# Patient Record
Sex: Female | Born: 1990 | Race: Black or African American | Hispanic: No | Marital: Single | Smoking: Never smoker
Health system: Southern US, Community
[De-identification: ages and names within clinical notes are randomized; demographics above are authoritative.]

## PROBLEM LIST (undated history)

## (undated) DIAGNOSIS — I639 Cerebral infarction, unspecified: Secondary | ICD-10-CM

## (undated) DIAGNOSIS — M329 Systemic lupus erythematosus, unspecified: Secondary | ICD-10-CM

## (undated) DIAGNOSIS — M359 Systemic involvement of connective tissue, unspecified: Secondary | ICD-10-CM

## (undated) HISTORY — PX: HERNIA REPAIR: SHX51

---
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
## 2015-04-26 ENCOUNTER — Emergency Department: Payer: BLUE CROSS/BLUE SHIELD

## 2015-04-26 ENCOUNTER — Emergency Department
Admission: EM | Admit: 2015-04-26 | Discharge: 2015-04-26 | Disposition: A | Discharge status: Home / Self Care | Payer: BLUE CROSS/BLUE SHIELD | Attending: Emergency Medicine | Admitting: Emergency Medicine

## 2015-04-26 DIAGNOSIS — J189 Pneumonia, unspecified organism: Secondary | ICD-10-CM | POA: Diagnosis not present

## 2015-04-26 DIAGNOSIS — R000 Tachycardia, unspecified: Secondary | ICD-10-CM | POA: Diagnosis not present

## 2015-04-26 DIAGNOSIS — R0602 Shortness of breath: Secondary | ICD-10-CM | POA: Diagnosis present

## 2015-04-26 LAB — CBC WITH DIFFERENTIAL/PLATELET
Basophils Absolute: 0 10*3/uL (ref 0–0.1)
Basophils Relative: 0 %
EOS ABS: 0 10*3/uL (ref 0–0.7)
EOS PCT: 0 %
HCT: 39 % (ref 35.0–47.0)
Hemoglobin: 13 g/dL (ref 12.0–16.0)
LYMPHS ABS: 1 10*3/uL (ref 1.0–3.6)
Lymphocytes Relative: 5 %
MCH: 27.1 pg (ref 26.0–34.0)
MCHC: 33.4 g/dL (ref 32.0–36.0)
MCV: 81.2 fL (ref 80.0–100.0)
MONO ABS: 1.5 10*3/uL — AB (ref 0.2–0.9)
MONOS PCT: 8 %
Neutro Abs: 16 10*3/uL — ABNORMAL HIGH (ref 1.4–6.5)
Neutrophils Relative %: 87 %
PLATELETS: 274 10*3/uL (ref 150–440)
RBC: 4.8 MIL/uL (ref 3.80–5.20)
RDW: 15.1 % — AB (ref 11.5–14.5)
WBC: 18.6 10*3/uL — ABNORMAL HIGH (ref 3.6–11.0)

## 2015-04-26 LAB — BASIC METABOLIC PANEL
Anion gap: 9 (ref 5–15)
BUN: 8 mg/dL (ref 6–20)
CHLORIDE: 104 mmol/L (ref 101–111)
CO2: 19 mmol/L — ABNORMAL LOW (ref 22–32)
Calcium: 8.5 mg/dL — ABNORMAL LOW (ref 8.9–10.3)
Creatinine, Ser: 0.88 mg/dL (ref 0.44–1.00)
GFR calc Af Amer: >60 mL/min (ref >60)
Glucose, Bld: 109 mg/dL — ABNORMAL HIGH (ref 65–99)
POTASSIUM: 3.3 mmol/L — AB (ref 3.5–5.1)
SODIUM: 132 mmol/L — AB (ref 135–145)

## 2015-04-26 LAB — RAPID INFLUENZA A&B ANTIGENS (ARMC ONLY): INFLUENZA B (ARMC): NEGATIVE

## 2015-04-26 LAB — RAPID INFLUENZA A&B ANTIGENS: Influenza A (ARMC): NEGATIVE

## 2015-04-26 MED ORDER — LEVOFLOXACIN 750 MG PO TABS: 750.0000 mg | ORAL_TABLET | Freq: Every day | ORAL | Status: AC

## 2015-04-26 MED ORDER — LEVOFLOXACIN 750 MG PO TABS
750.0000 mg | ORAL_TABLET | Freq: Every day | ORAL | Status: DC
  Administered 2015-04-26: 750 mg via ORAL
  Filled 2015-04-26: qty 1

## 2015-04-26 MED ORDER — ACETAMINOPHEN 325 MG PO TABS
650.0000 mg | ORAL_TABLET | Freq: Once | ORAL | Status: AC | PRN
  Administered 2015-04-26: 650 mg via ORAL
  Filled 2015-04-26: qty 2

## 2015-04-26 MED ORDER — SODIUM CHLORIDE 0.9 % IV BOLUS (SEPSIS)
1000.0000 mL | Freq: Once | INTRAVENOUS | Status: AC
  Administered 2015-04-26: 1000 mL via INTRAVENOUS

## 2015-04-26 NOTE — Discharge Instructions (Signed)
Please seek medical attention for any high fevers, chest pain, shortness of breath, change in behavior, persistent vomiting, bloody stool or any other new or concerning symptoms. ° °Community-Acquired Pneumonia, Adult °Pneumonia is an infection of the lungs. There are different types of pneumonia. One type can develop while a person is in a hospital. A different type, called community-acquired pneumonia, develops in people who are not, or have not recently been, in the hospital or other health care facility.  °CAUSES °Pneumonia may be caused by bacteria, viruses, or funguses. Community-acquired pneumonia is often caused by Streptococcus pneumonia bacteria. These bacteria are often passed from one person to another by breathing in droplets from the cough or sneeze of an infected person. °RISK FACTORS °The condition is more likely to develop in: °· People who have chronic diseases, such as chronic obstructive pulmonary disease (COPD), asthma, congestive heart failure, cystic fibrosis, diabetes, or kidney disease. °· People who have early-stage or late-stage HIV. °· People who have sickle cell disease. °· People who have had their spleen removed (splenectomy). °· People who have poor dental hygiene. °· People who have medical conditions that increase the risk of breathing in (aspirating) secretions their own mouth and nose.   °· People who have a weakened immune system (immunocompromised). °· People who smoke. °· People who travel to areas where pneumonia-causing germs commonly exist. °· People who are around animal habitats or animals that have pneumonia-causing germs, including birds, bats, rabbits, cats, and farm animals. °SYMPTOMS °Symptoms of this condition include: °· A dry cough. °· A wet (productive) cough. °· Fever. °· Sweating. °· Chest pain, especially when breathing deeply or coughing. °· Rapid breathing or difficulty breathing. °· Shortness of breath. °· Shaking chills. °· Fatigue. °· Muscle  aches. °DIAGNOSIS °Your health care provider will take a medical history and perform a physical exam. You may also have other tests, including: °· Imaging studies of your chest, including X-rays. °· Tests to check your blood oxygen level and other blood gases. °· Other tests on blood, mucus (sputum), fluid around your lungs (pleural fluid), and urine. °If your pneumonia is severe, other tests may be done to identify the specific cause of your illness. °TREATMENT °The type of treatment that you receive depends on many factors, such as the cause of your pneumonia, the medicines you take, and other medical conditions that you have. For most adults, treatment and recovery from pneumonia may occur at home. In some cases, treatment must happen in a hospital. Treatment may include: °· Antibiotic medicines, if the pneumonia was caused by bacteria. °· Antiviral medicines, if the pneumonia was caused by a virus. °· Medicines that are given by mouth or through an IV tube. °· Oxygen. °· Respiratory therapy. °Although rare, treating severe pneumonia may include: °· Mechanical ventilation. This is done if you are not breathing well on your own and you cannot maintain a safe blood oxygen level. °· Thoracentesis. This procedure removes fluid around one lung or both lungs to help you breathe better. °HOME CARE INSTRUCTIONS °· Take over-the-counter and prescription medicines only as told by your health care provider. °¨ Only take cough medicine if you are losing sleep. Understand that cough medicine can prevent your body's natural ability to remove mucus from your lungs. °¨ If you were prescribed an antibiotic medicine, take it as told by your health care provider. Do not stop taking the antibiotic even if you start to feel better. °· Sleep in a semi-upright position at night. Try sleeping in a reclining chair, or place a few pillows under your head. °· Do   not use tobacco products, including cigarettes, chewing tobacco, and  e-cigarettes. If you need help quitting, ask your health care provider. °· Drink enough water to keep your urine clear or pale yellow. This will help to thin out mucus secretions in your lungs. °PREVENTION °There are ways that you can decrease your risk of developing community-acquired pneumonia. Consider getting a pneumococcal vaccine if: °· You are older than 27 years of age. °· You are older than 27 years of age and are undergoing cancer treatment, have chronic lung disease, or have other medical conditions that affect your immune system. Ask your health care provider if this applies to you. °There are different types and schedules of pneumococcal vaccines. Ask your health care provider which vaccination option is best for you. °You may also prevent community-acquired pneumonia if you take these actions: °· Get an influenza vaccine every year. Ask your health care provider which type of influenza vaccine is best for you. °· Go to the dentist on a regular basis. °· Wash your hands often. Use hand sanitizer if soap and water are not available. °SEEK MEDICAL CARE IF: °· You have a fever. °· You are losing sleep because you cannot control your cough with cough medicine. °SEEK IMMEDIATE MEDICAL CARE IF: °· You have worsening shortness of breath. °· You have increased chest pain. °· Your sickness becomes worse, especially if you are an older adult or have a weakened immune system. °· You cough up blood. °  °This information is not intended to replace advice given to you by your health care provider. Make sure you discuss any questions you have with your health care provider. °  °Document Released: 01/04/2005 Document Revised: 09/25/2014 Document Reviewed: 05/01/2014 °Elsevier Interactive Patient Education ©2016 [employer] ° °

## 2015-04-26 NOTE — ED Provider Notes (Signed)
[facility] Emergency Department Provider Note    ____________________________________________  Time seen: 1435  I have reviewed the triage vital signs and the nursing notes.   HISTORY  Chief Complaint Tachycardia and Shortness of Breath   History limited by: Not Limited   HPI Lydia Miller is a 25 y.o. female who presents to the emergency department today because of concerns for continued shortness of breath, chills and chest pain. The patient states that she was diagnosed with an upper respiratory infection roughly 2 weeks ago. She was put on a course of amoxicillin. She states she finished that 6 days ago. Since that time she has continued to have some cold-like symptoms. She describes shortness of breath. She has had lower chest pain. She describes a cough. Yesterday and today she has felt chills. No measured fevers. 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     No past medical history on file.  There are no active problems to display for this patient.   No past surgical history on file.  Current Outpatient Rx  Name  Route  Sig  Dispense  Refill  . levofloxacin (LEVAQUIN) 750 MG tablet   Oral   Take 1 tablet (750 mg total) by mouth daily.   7 tablet   0     Allergies Review of patient's allergies indicates no known allergies.  No family history on file.  Social History Works in a bank  Review of Systems  Constitutional: Positive for chills. Cardiovascular: Positive for chest pain. Respiratory: Positive for shortness of breath. Gastrointestinal: Negative for abdominal pain, vomiting and diarrhea. Neurological: Negative for headaches, focal weakness or numbness.  10-point ROS otherwise negative.  ____________________________________________   PHYSICAL EXAM:  VITAL SIGNS: ED Triage Vitals  Enc Vitals Group     BP 04/26/15 1351 121/99 mmHg     Pulse Rate 04/26/15 1351 117     Resp 04/26/15 1351 26      Temp 04/26/15 1351 102.7 F (39.3 C)     Temp Source 04/26/15 1351 Oral     SpO2 04/26/15 1351 98 %     Weight 04/26/15 1351 190 lb (86.183 kg)     Height 04/26/15 1351  (1.676 m)     Head Cir --      Peak Flow --      Pain Score 04/26/15 1358 8   Constitutional: Alert and oriented. Well appearing and in no distress. Eyes: Conjunctivae are normal. PERRL. Normal extraocular movements. ENT   Head: Normocephalic and atraumatic.   Nose: No congestion/rhinnorhea.   Mouth/Throat: Mucous membranes are moist.   Neck: No stridor. Hematological/Lymphatic/Immunilogical: No cervical lymphadenopathy. Cardiovascular: tachycardic, regular rhythm.  No murmurs, rubs, or gallops. Respiratory: Minimally increased respiratory effort. Tachypnea. No wheezes, rhonchi or crackles appreciated.  Gastrointestinal: Soft and nontender. No distention.  Genitourinary: Deferred Musculoskeletal: Normal range of motion in all extremities. No joint effusions.  No lower extremity tenderness nor edema. Neurologic:  Normal speech and language. No gross focal neurologic deficits are appreciated.  Skin:  Skin is warm, dry and intact. No rash noted. Psychiatric: Mood and affect are normal. Speech and behavior are normal. Patient exhibits appropriate insight and judgment.  ____________________________________________    LABS (pertinent positives/negatives)  Labs Reviewed  CBC WITH DIFFERENTIAL/PLATELET - Abnormal; Notable for the following:    WBC 18.6 (*)    RDW 15.1 (*)    Neutro Abs 16.0 (*)    Monocytes Absolute  1.5 (*)    All other components within normal limits  BASIC METABOLIC PANEL - Abnormal; Notable for the following:    Sodium 132 (*)    Potassium 3.3 (*)    CO2 19 (*)    Glucose, Bld 109 (*)    Calcium 8.5 (*)    All other components within normal limits  RAPID INFLUENZA A&B ANTIGENS ([facility] ONLY)    ____________________________________________   EKG  I, Phineas SemenGraydon Angellica Maddison,  attending physician, personally viewed and interpreted this EKG  EKG Time: 1357 Rate: 117 Rhythm: sinus tachycardia Axis: normal Intervals: qtc 393 QRS: narrow ST changes: no st elevation Impression: abnormal ekg   ____________________________________________    RADIOLOGY  CXR IMPRESSION: Small left lower lobe pneumonia.  ____________________________________________   PROCEDURES  Procedure(s) performed: None  Critical Care performed: No  ____________________________________________   INITIAL IMPRESSION / ASSESSMENT AND PLAN / ED COURSE  Pertinent labs & imaging results that were available during my care of the patient were reviewed by me and considered in my medical decision making (see chart for details).  Patient presented to the emergency department today because of concerns for shortness of breath, chills and chest pain. Chest x-ray is consistent with a pneumonia. Patient recently finished a course of amoxicillin for an upper respiratory tract infection. Patient was tachycardic so was given IV fluids. Will give patient a dose of Levaquin here in the emergency Department and plan on discharging with further antibiotics.  ____________________________________________   FINAL CLINICAL IMPRESSION(S) / ED DIAGNOSES  Final diagnoses:  Community acquired pneumonia     Phineas SemenGraydon Azeez Dunker, MD 04/26/15 1541

## 2015-04-26 NOTE — ED Notes (Addendum)
Pt with recent cold symptoms. C/o SOB. fever 102.7

## 2015-04-29 ENCOUNTER — Other Ambulatory Visit: Payer: Self-pay | Admitting: Family Medicine

## 2015-04-29 ENCOUNTER — Ambulatory Visit
Admission: RE | Admit: 2015-04-29 | Discharge: 2015-04-29 | Disposition: A | Discharge status: Home / Self Care | Payer: BLUE CROSS/BLUE SHIELD | Source: Ambulatory Visit | Attending: Family Medicine | Admitting: Family Medicine

## 2015-04-29 DIAGNOSIS — J189 Pneumonia, unspecified organism: Secondary | ICD-10-CM | POA: Diagnosis not present

## 2015-04-29 DIAGNOSIS — R599 Enlarged lymph nodes, unspecified: Secondary | ICD-10-CM | POA: Diagnosis not present

## 2015-04-29 DIAGNOSIS — Z8701 Personal history of pneumonia (recurrent): Secondary | ICD-10-CM | POA: Diagnosis present

## 2015-04-29 MED ORDER — IOPAMIDOL (ISOVUE-370) INJECTION 76%
75.0000 mL | Freq: Once | INTRAVENOUS | Status: AC | PRN
  Administered 2015-04-29: 75 mL via INTRAVENOUS

## 2015-05-20 ENCOUNTER — Emergency Department: Payer: BLUE CROSS/BLUE SHIELD

## 2015-05-20 ENCOUNTER — Emergency Department
Admission: EM | Admit: 2015-05-20 | Discharge: 2015-05-20 | Disposition: A | Discharge status: Home / Self Care | Payer: BLUE CROSS/BLUE SHIELD | Attending: Emergency Medicine | Admitting: Emergency Medicine

## 2015-05-20 DIAGNOSIS — R0789 Other chest pain: Secondary | ICD-10-CM | POA: Diagnosis present

## 2015-05-20 DIAGNOSIS — R0781 Pleurodynia: Secondary | ICD-10-CM | POA: Insufficient documentation

## 2015-05-20 MED ORDER — MELOXICAM 15 MG PO TABS: 15.0000 mg | ORAL_TABLET | Freq: Every day | ORAL | Status: DC

## 2015-05-20 MED ORDER — ALPRAZOLAM 0.25 MG PO TABS: 0.2500 mg | ORAL_TABLET | Freq: Three times a day (TID) | ORAL | Status: AC | PRN

## 2015-05-20 NOTE — ED Notes (Signed)
Pt in via triage with complaints of intermittent right rib pain since she had PNA in April.  Pt reports being seen for follow up appointment, CT performed to rule out PE.  Pt denies any SOB, cough, congestion.  Pt A/Ox4, no immediate distress at this time.

## 2015-05-20 NOTE — ED Notes (Signed)
Pt in with co right rib pain that started today, states only when she takes a deep breath.  No other symptoms, denies any cough or congestion.  Denies any fever at this time, pt concerned about pneumonia due to hx of pneumonia in April.

## 2015-05-20 NOTE — Discharge Instructions (Signed)
Chest Wall Pain °Chest wall pain is pain in or around the bones and muscles of your chest. Sometimes, an injury causes this pain. Sometimes, the cause may not be known. This pain may take several weeks or longer to get better. °HOME CARE °Pay attention to any changes in your symptoms. Take these actions to help with your pain: °· Rest as told by your doctor. °· Avoid activities that cause pain. Try not to use your chest, belly (abdominal), or side muscles to lift heavy things. °· If directed, apply ice to the painful area: °¨ Put ice in a plastic bag. °¨ Place a towel between your skin and the bag. °¨ Leave the ice on for 20 minutes, 2-3 times per day. °· Take over-the-counter and prescription medicines only as told by your doctor. °· Do not use tobacco products, including cigarettes, chewing tobacco, and e-cigarettes. If you need help quitting, ask your doctor. °· Keep all follow-up visits as told by your doctor. This is important. °GET HELP IF: °· You have a fever. °· Your chest pain gets worse. °· You have new symptoms. °GET HELP RIGHT AWAY IF: °· You feel sick to your stomach (nauseous) or you throw up (vomit). °· You feel sweaty or light-headed. °· You have a cough with phlegm (sputum) or you cough up blood. °· You are short of breath. °  °This information is not intended to replace advice given to you by your health care provider. Make sure you discuss any questions you have with your health care provider. °  °Document Released: 06/23/2007 Document Revised: 09/25/2014 Document Reviewed: 04/01/2014 °Elsevier Interactive Patient Education ©2016 [employer] Inc[employer] ° °

## 2015-05-20 NOTE — ED Notes (Signed)
Pt taken to xray 

## 2015-05-20 NOTE — ED Provider Notes (Signed)
[facility] Emergency Department Provider Note ____________________________________________  Time seen: Approximately 8:06 PM  I have reviewed the triage vital signs and the nursing notes.   HISTORY  Chief Complaint Back Pain    HPI Lydia Miller is a 25 y.o. female who presents to the emergency department for evaluation of intermittent rib pain since having pneumonia in April. She denies shortness breath, cough, congestion or fever today.Patient states that she had a CT scan at [facility]. She states that while at work today, she began having a sharp pain in the right rib area. She is concerned that either the pneumonia has not cleared or she has a "blood clot." She also states that she is wondering if this can be related to anxiety. 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  No past medical history on file.  There are no active problems to display for this patient.   No past surgical history on file.  Current Outpatient Rx  Name  Route  Sig  Dispense  Refill  . ALPRAZolam (XANAX) 0.25 MG tablet   Oral   Take 1 tablet (0.25 mg total) by mouth 3 (three) times daily as needed for anxiety.   14 tablet   0   . meloxicam (MOBIC) 15 MG tablet   Oral   Take 1 tablet (15 mg total) by mouth daily.   30 tablet   0     Allergies Review of patient's allergies indicates no known allergies.  No family history on file.  Social History Social History  Substance Use Topics  . Smoking status: Not on file  . Smokeless tobacco: Not on file  . Alcohol Use: Not on file    Review of Systems Constitutional: Positive for recent illness. ENT: No sore throat. Cardiovascular: Denies chest pain or palpitations. Respiratory: Denies shortness of breath. Gastrointestinal: No abdominal pain.  Musculoskeletal: Pain in right lateral chest wall Skin: Negative for rash. Neurological: Negative for  headaches, focal weakness or numbness.   ____________________________________________   PHYSICAL EXAM:  VITAL SIGNS: ED Triage Vitals  Enc Vitals Group     BP 05/20/15 1921 148/88 mmHg     Pulse Rate 05/20/15 1921 90     Resp 05/20/15 1921 18     Temp 05/20/15 1921 98.3 F (36.8 C)     Temp Source 05/20/15 1921 Oral     SpO2 05/20/15 1921 100 %     Weight 05/20/15 1921 183 lb (83.008 kg)     Height 05/20/15 1921 5\' 7"  (1.702 m)     Head Cir --      Peak Flow --      Pain Score 05/20/15 1923 7     Pain Loc --      Pain Edu? --      Excl. in GC? --     Constitutional: Alert and oriented. Well appearing and in no acute distress. Eyes: Conjunctivae are normal. EOMI. Head: Atraumatic. Nose: No congestion/rhinnorhea. Neck: No stridor.  Respiratory: Normal respiratory effort. Breath sounds clear on auscultation throughout. Musculoskeletal: FROM x 4.  Neurologic:  Normal speech and language. No gross focal neurologic deficits are appreciated. Speech is normal. No gait instability. Skin:  Skin is warm, dry and intact. Atraumatic. Psychiatric: Anxious  ____________________________________________   LABS (all labs ordered are listed, but only abnormal results are displayed)  Labs Reviewed - No data to display ____________________________________________  RADIOLOGY  Chest x-ray negative for pneumonia or acute cardiopulmonary abnormality. I, Kem Boroughs, personally viewed and evaluated these images (plain radiographs) as part of my medical decision making, as well as reviewing the written report by the radiologist.  ____________________________________________   PROCEDURES  Procedure(s) performed:    ____________________________________________   INITIAL IMPRESSION / ASSESSMENT AND PLAN / ED COURSE  Pertinent labs & imaging results that were available during my care of the patient were reviewed by me and considered in my medical decision making (see chart for  details).  Reassurance given to the patient. She will be given a prescription for Xanax 0.25 mg to be used as needed for acute anxiety. She was also given a prescription for meloxicam. She was advised to avoid taking any additional ibuprofen or Motrin as she has been doing since her diagnosis of pneumonia. She was encouraged to follow up with her primary care provider for symptoms that are not improving over the next few days. She was advised to return to the emergency department for symptoms that change or worsen if some unable schedule an appointment. ____________________________________________   FINAL CLINICAL IMPRESSION(S) / ED DIAGNOSES  Final diagnoses:  Pleuritic chest pain       Chinita Pester, FNP 05/20/15 2351  Myrna Blazer, MD 05/22/15 [phone]

## 2015-12-18 ENCOUNTER — Other Ambulatory Visit: Payer: Self-pay | Admitting: Student

## 2015-12-18 DIAGNOSIS — K625 Hemorrhage of anus and rectum: Secondary | ICD-10-CM

## 2015-12-18 DIAGNOSIS — R1084 Generalized abdominal pain: Secondary | ICD-10-CM

## 2016-01-25 ENCOUNTER — Other Ambulatory Visit
Admission: RE | Admit: 2016-01-25 | Discharge: 2016-01-25 | Disposition: A | Discharge status: Home / Self Care | Payer: BLUE CROSS/BLUE SHIELD | Source: Other Acute Inpatient Hospital | Attending: Student | Admitting: Student

## 2016-01-25 DIAGNOSIS — R197 Diarrhea, unspecified: Secondary | ICD-10-CM | POA: Insufficient documentation

## 2016-01-25 LAB — GASTROINTESTINAL PANEL BY PCR, STOOL (REPLACES STOOL CULTURE)

## 2016-01-25 LAB — C DIFFICILE QUICK SCREEN W PCR REFLEX
C Diff antigen: NEGATIVE
C Diff interpretation: NOT DETECTED
C Diff toxin: NEGATIVE

## 2016-01-28 LAB — PANCREATIC ELASTASE, FECAL

## 2016-01-29 ENCOUNTER — Other Ambulatory Visit: Payer: Self-pay | Admitting: Student

## 2016-01-29 ENCOUNTER — Ambulatory Visit: Admission: RE | Admit: 2016-01-29 | Payer: BLUE CROSS/BLUE SHIELD | Source: Ambulatory Visit

## 2016-01-29 DIAGNOSIS — R1084 Generalized abdominal pain: Secondary | ICD-10-CM

## 2016-01-29 DIAGNOSIS — K625 Hemorrhage of anus and rectum: Secondary | ICD-10-CM

## 2016-01-29 DIAGNOSIS — R197 Diarrhea, unspecified: Secondary | ICD-10-CM

## 2016-02-02 ENCOUNTER — Ambulatory Visit
Admission: RE | Admit: 2016-02-02 | Discharge: 2016-02-02 | Disposition: A | Discharge status: Home / Self Care | Payer: BLUE CROSS/BLUE SHIELD | Source: Ambulatory Visit | Attending: Student | Admitting: Student

## 2016-02-02 DIAGNOSIS — K625 Hemorrhage of anus and rectum: Secondary | ICD-10-CM

## 2016-02-02 DIAGNOSIS — R1084 Generalized abdominal pain: Secondary | ICD-10-CM

## 2016-02-02 DIAGNOSIS — R197 Diarrhea, unspecified: Secondary | ICD-10-CM

## 2016-02-02 MED ORDER — IOPAMIDOL (ISOVUE-300) INJECTION 61%
100.0000 mL | Freq: Once | INTRAVENOUS | Status: AC | PRN
Start: 2016-02-02 — End: 2016-02-02
  Administered 2016-02-02: 100 mL via INTRAVENOUS

## 2016-03-02 ENCOUNTER — Emergency Department
Admission: EM | Admit: 2016-03-02 | Discharge: 2016-03-02 | Disposition: A | Discharge status: Home / Self Care | Payer: BLUE CROSS/BLUE SHIELD | Attending: Emergency Medicine | Admitting: Emergency Medicine

## 2016-03-02 ENCOUNTER — Encounter: Payer: Self-pay | Admitting: Emergency Medicine

## 2016-03-02 DIAGNOSIS — Z7982 Long term (current) use of aspirin: Secondary | ICD-10-CM | POA: Diagnosis not present

## 2016-03-02 DIAGNOSIS — Z8673 Personal history of transient ischemic attack (TIA), and cerebral infarction without residual deficits: Secondary | ICD-10-CM | POA: Insufficient documentation

## 2016-03-02 DIAGNOSIS — R42 Dizziness and giddiness: Secondary | ICD-10-CM | POA: Diagnosis not present

## 2016-03-02 HISTORY — DX: Systemic lupus erythematosus, unspecified: M32.9

## 2016-03-02 HISTORY — DX: Cerebral infarction, unspecified: I63.9

## 2016-03-02 LAB — URINALYSIS, COMPLETE (UACMP) WITH MICROSCOPIC
BILIRUBIN URINE: NEGATIVE
Glucose, UA: NEGATIVE mg/dL
Hgb urine dipstick: NEGATIVE
Ketones, ur: NEGATIVE mg/dL
Leukocytes, UA: NEGATIVE
Nitrite: NEGATIVE
PH: 6 (ref 5.0–8.0)
Protein, ur: NEGATIVE mg/dL
RBC / HPF: NONE SEEN RBC/hpf (ref 0–5)
SPECIFIC GRAVITY, URINE: 1.005 (ref 1.005–1.030)

## 2016-03-02 LAB — BASIC METABOLIC PANEL
ANION GAP: 7 (ref 5–15)
BUN: 16 mg/dL (ref 6–20)
CO2: 24 mmol/L (ref 22–32)
Calcium: 8.9 mg/dL (ref 8.9–10.3)
Chloride: 104 mmol/L (ref 101–111)
Creatinine, Ser: 0.9 mg/dL (ref 0.44–1.00)
GFR calc Af Amer: >60 mL/min (ref >60)
GFR calc non Af Amer: >60 mL/min (ref >60)
GLUCOSE: 92 mg/dL (ref 65–99)
POTASSIUM: 3.8 mmol/L (ref 3.5–5.1)
Sodium: 135 mmol/L (ref 135–145)

## 2016-03-02 LAB — CBC
HEMATOCRIT: 38 % (ref 35.0–47.0)
HEMOGLOBIN: 12.4 g/dL (ref 12.0–16.0)
MCH: 25.4 pg — AB (ref 26.0–34.0)
MCHC: 32.6 g/dL (ref 32.0–36.0)
MCV: 78 fL — AB (ref 80.0–100.0)
Platelets: 302 10*3/uL (ref 150–440)
RBC: 4.87 MIL/uL (ref 3.80–5.20)
RDW: 15.1 % — AB (ref 11.5–14.5)
WBC: 8 10*3/uL (ref 3.6–11.0)

## 2016-03-02 LAB — TROPONIN I

## 2016-03-02 LAB — POCT PREGNANCY, URINE: PREG TEST UR: NEGATIVE

## 2016-03-02 MED ORDER — MECLIZINE HCL 25 MG PO TABS: 25.0000 mg | ORAL_TABLET | Freq: Three times a day (TID) | ORAL | 0 refills | Status: DC | PRN

## 2016-03-02 NOTE — ED Provider Notes (Signed)
[facility] Emergency Department Provider Note  ____________________________________________   First MD Initiated Contact with Patient 03/02/16 2103     (approximate)  I have reviewed the triage vital signs and the nursing notes.   HISTORY  Chief Complaint Dizziness    HPI Lydia Miller is a [age] y.o. female with a history of lupus who also reports a prior stroke versus TIA who presents for evaluation of several brief episodes which she describes as dizziness.  She states that she stood up yesterday and felt suddenly dizzy.  The symptoms were very brief but she felt off balance for a short period of time until the symptoms completely resolved.  This worried her given her prior history of CVA.  It occurred again at work today and has resolved but states that her head "feels funny".  She has not had any numbness, tingling, nor weakness in any of her extremities.  She is not having any difficulty with ambulation.  She denies headache, sinus pain, nausea, vomiting, abdominal pain, diarrhea.  She does state that she has also felt a pain in the left side of her jaw are recently and does not know why that happened.  When she was waiting in the exam room she says that the middle finger of her left hand started to hurt and she wondered why that is happening but suspects it is just her lupus.She denies any recent URI symptoms including fever/chills, nasal congestion, runny nose, sore throat.  She has not had any difficulty with word finding and has had no confusion.   Past Medical History:  Diagnosis Date  . Lupus   . Stroke [facility](HCC)     There are no active problems to display for this patient.   Past Surgical History:  Procedure Laterality Date  . HERNIA REPAIR      Prior to Admission medications   Medication Sig Start Date End Date Taking? Authorizing Provider  aspirin EC 81 MG tablet Take 81 mg by mouth daily.   Yes Historical Provider, MD  hydroxychloroquine  (PLAQUENIL) 200 MG tablet Take 200 mg by mouth 2 (two) times daily.   Yes Historical Provider, MD  ALPRAZolam (XANAX) 0.25 MG tablet Take 1 tablet (0.25 mg total) by mouth 3 (three) times daily as needed for anxiety. 05/20/15 05/19/16  Chinita Pesterari B Triplett, FNP  meclizine (ANTIVERT) 25 MG tablet Take 1 tablet (25 mg total) by mouth 3 (three) times daily as needed for dizziness. 03/02/16   Loleta Roseory Tymere Depuy, MD  meloxicam (MOBIC) 15 MG tablet Take 1 tablet (15 mg total) by mouth daily. 05/20/15   Chinita Pesterari B Triplett, FNP    Allergies Patient has no known allergies.  No family history on file.  Social History Social History  Substance Use Topics  . Smoking status: Never Smoker  . Smokeless tobacco: Never Used  . Alcohol use No    Review of Systems Constitutional: No fever/chills Eyes: No visual changes. ENT: No sore throat. Cardiovascular: Denies chest pain. Respiratory: Denies shortness of breath. Gastrointestinal: No abdominal pain.  No nausea, no vomiting.  No diarrhea.  No constipation. Genitourinary: Negative for dysuria. Musculoskeletal: Negative for back pain. Pain in the left side of her jaw, intermittent.  Acute onset in the ED exam room with pain in her left middle finger. Skin: Negative for rash. Neurological: Intermittent episodes of dizziness since yesterday.  Negative for headaches, focal weakness or numbness.   10-point ROS otherwise negative.  ____________________________________________   PHYSICAL EXAM:  VITAL  SIGNS: ED Triage Vitals  Enc Vitals Group     BP 03/02/16 1909 117/70     Pulse Rate 03/02/16 1909 70     Resp 03/02/16 1909 18     Temp 03/02/16 1909 97.7 F (36.5 C)     Temp Source 03/02/16 1909 Oral     SpO2 03/02/16 1909 99 %     Weight 03/02/16 1914 199 lb (90.3 kg)     Height 03/02/16 1914 5\' 7"  (1.702 m)     Head Circumference --      Peak Flow --      Pain Score --      Pain Loc --      Pain Edu? --      Excl. in GC? --     Constitutional: Alert and  oriented. Well appearing and in no acute distress.   Eyes: Conjunctivae are normal. PERRL. EOMI. No nystagmus on extremes of horizontal gaze. Head: Atraumatic. Ears:  Healthy appearing ear canals and TMs bilaterally Nose: No congestion/rhinnorhea. Mouth/Throat: Mucous membranes are moist. Neck: No stridor.  No meningeal signs.   Cardiovascular: Normal rate, regular rhythm. Good peripheral circulation. Grossly normal heart sounds. Respiratory: Normal respiratory effort.  No retractions. Lungs CTAB. Gastrointestinal: Soft and nontender. No distention.  Musculoskeletal: No lower extremity tenderness nor edema. No gross deformities of extremities. Neurologic:  Normal speech and language. No gross focal neurologic deficits are appreciated.  No facial droop, normal speech, no word finding difficulties.  Normal grip strength bilaterally.  Normal strength in major muscle groups in upper and lower extremities.  Negative Romberg, no pronator drift, ambulating a straight line with no gait instability.  No finger to nose dysmetria. Skin:  Skin is warm, dry and intact. No rash noted. Psychiatric: Mood and affect are anxious but essentially normal. Speech and behavior are normal.  ____________________________________________   LABS (all labs ordered are listed, but only abnormal results are displayed)  Labs Reviewed  CBC - Abnormal; Notable for the following:       Result Value   MCV 78.0 (*)    MCH 25.4 (*)    RDW 15.1 (*)    All other components within normal limits  URINALYSIS, COMPLETE (UACMP) WITH MICROSCOPIC - Abnormal; Notable for the following:    Color, Urine COLORLESS (*)    APPearance CLEAR (*)    Bacteria, UA RARE (*)    Squamous Epithelial / LPF 0-5 (*)    All other components within normal limits  BASIC METABOLIC PANEL  TROPONIN I  POC URINE PREG, ED  POCT PREGNANCY, URINE   ____________________________________________  EKG  ED ECG REPORT I, Oneal Biglow, the attending  physician, personally viewed and interpreted this ECG.  Date: 03/02/2016 EKG Time: 19:23 Rate: 75 Rhythm: normal sinus rhythm QRS Axis: normal Intervals: normal ST/T Wave abnormalities: normal Conduction Disturbances: none Narrative Interpretation: unremarkable  ____________________________________________  RADIOLOGY   No results found.  ____________________________________________   PROCEDURES  Procedure(s) performed:   Procedures   Critical Care performed: No ____________________________________________   INITIAL IMPRESSION / ASSESSMENT AND PLAN / ED COURSE  Pertinent labs & imaging results that were available during my care of the patient were reviewed by me and considered in my medical decision making (see chart for details).  Due to the patient's chronic medical illnesses I believe that she understandably is very concerned when she experiences any pains or symptoms/feelings that are out of the ordinary.  However, she has had a very reassuring workup in the  emergency department with normal vital signs, normal labs, normal EKG, and a thorough and reassuring neurological exam.  She has no signs or symptoms of any focal neurological deficits.  I provided reassurance and explained that while I cannot explain her constellation of symptoms that includes intermittent mild dizziness, left middle finger pain, left-sided jaw pain, occasional pain in her right ear, etc., there is no indication of any acute or emergent medical condition at this time.  I explained that I do not feel that any imaging is indicated and explained why if she was my family member I would not order any imaging at this time in favor of outpatient follow-up.     ____________________________________________  FINAL CLINICAL IMPRESSION(S) / ED DIAGNOSES  Final diagnoses:  Dizziness     MEDICATIONS GIVEN DURING THIS VISIT:  Medications - No data to display   NEW OUTPATIENT MEDICATIONS STARTED DURING  THIS VISIT:  Discharge Medication List as of 03/02/2016  9:32 PM    START taking these medications   Details  meclizine (ANTIVERT) 25 MG tablet Take 1 tablet (25 mg total) by mouth 3 (three) times daily as needed for dizziness., Starting Tue 03/02/2016, Print        Discharge Medication List as of 03/02/2016  9:32 PM      Discharge Medication List as of 03/02/2016  9:32 PM       Note:  This document was prepared using Dragon voice recognition software and may include unintentional dictation errors.    Loleta Rose, MD 03/03/16 [id number]

## 2016-03-02 NOTE — ED Triage Notes (Signed)
Patient ambulatory to triage with steady gait, without difficulty or distress noted; pt reports onset dizziness yesterday; today noted "my jaw kept locking up and it was hurting to talk"; st hx CVA and was concerned over symptoms; denies any pain or current symptoms, states "my head just feels funny"

## 2016-03-02 NOTE — Discharge Instructions (Signed)
As we discussed, your workup today was reassuring.  Though we do not know exactly what is causing your symptoms, it appears that you have no emergent medical condition at this time and that you are safe to go home and follow up as recommended in this paperwork.  Based on your exam, there is no indication that you have had a stroke or TIA.  Please take the prescribed medication as needed if you have additional episodes of dizziness.  We recommend you follow up with your PCP and determine if you need a referral to a neurologist for additional outpatient evaluation.  Please return immediately to the Emergency Department if you develop any new or worsening symptoms that concern you.

## 2016-04-21 ENCOUNTER — Other Ambulatory Visit: Payer: Self-pay | Admitting: Neurology

## 2016-04-21 DIAGNOSIS — R42 Dizziness and giddiness: Secondary | ICD-10-CM

## 2016-04-30 ENCOUNTER — Ambulatory Visit
Admission: RE | Admit: 2016-04-30 | Discharge: 2016-04-30 | Disposition: A | Discharge status: Home / Self Care | Payer: BLUE CROSS/BLUE SHIELD | Source: Ambulatory Visit | Attending: Neurology | Admitting: Neurology

## 2016-08-14 ENCOUNTER — Emergency Department: Payer: BLUE CROSS/BLUE SHIELD

## 2016-08-14 ENCOUNTER — Emergency Department
Admission: EM | Admit: 2016-08-14 | Discharge: 2016-08-14 | Disposition: A | Discharge status: Home / Self Care | Payer: BLUE CROSS/BLUE SHIELD | Attending: Emergency Medicine | Admitting: Emergency Medicine

## 2016-08-14 DIAGNOSIS — R079 Chest pain, unspecified: Secondary | ICD-10-CM | POA: Insufficient documentation

## 2016-08-14 DIAGNOSIS — M3210 Systemic lupus erythematosus, organ or system involvement unspecified: Secondary | ICD-10-CM | POA: Insufficient documentation

## 2016-08-14 DIAGNOSIS — Z7982 Long term (current) use of aspirin: Secondary | ICD-10-CM | POA: Insufficient documentation

## 2016-08-14 DIAGNOSIS — Z79899 Other long term (current) drug therapy: Secondary | ICD-10-CM | POA: Diagnosis not present

## 2016-08-14 LAB — BASIC METABOLIC PANEL
ANION GAP: 6 (ref 5–15)
BUN: 10 mg/dL (ref 6–20)
CHLORIDE: 108 mmol/L (ref 101–111)
CO2: 23 mmol/L (ref 22–32)
Calcium: 8.7 mg/dL — ABNORMAL LOW (ref 8.9–10.3)
Creatinine, Ser: 0.85 mg/dL (ref 0.44–1.00)
GFR calc Af Amer: >60 mL/min (ref >60)
GFR calc non Af Amer: >60 mL/min (ref >60)
GLUCOSE: 102 mg/dL — AB (ref 65–99)
POTASSIUM: 3.7 mmol/L (ref 3.5–5.1)
Sodium: 137 mmol/L (ref 135–145)

## 2016-08-14 LAB — CBC
HEMATOCRIT: 36.3 % (ref 35.0–47.0)
HEMOGLOBIN: 11.9 g/dL — AB (ref 12.0–16.0)
MCH: 25 pg — AB (ref 26.0–34.0)
MCHC: 32.9 g/dL (ref 32.0–36.0)
MCV: 76 fL — AB (ref 80.0–100.0)
Platelets: 320 10*3/uL (ref 150–440)
RBC: 4.78 MIL/uL (ref 3.80–5.20)
RDW: 15.2 % — ABNORMAL HIGH (ref 11.5–14.5)
WBC: 7 10*3/uL (ref 3.6–11.0)

## 2016-08-14 LAB — TROPONIN I
Troponin I: <0.03 ng/mL (ref <0.03)
Troponin I: <0.03 ng/mL (ref <0.03)

## 2016-08-14 LAB — POCT PREGNANCY, URINE: Preg Test, Ur: NEGATIVE

## 2016-08-14 MED ORDER — ASPIRIN 81 MG PO CHEW
324.0000 mg | CHEWABLE_TABLET | Freq: Once | ORAL | Status: AC
  Administered 2016-08-14: 324 mg via ORAL
  Filled 2016-08-14: qty 4

## 2016-08-14 NOTE — ED Triage Notes (Signed)
Left side chest pain intermittent that radiates down whole left arm. described as aching and burning. Pt alert and oriented X4, active, cooperative, pt in NAD. RR even and unlabored, color WNL.

## 2016-08-14 NOTE — Discharge Instructions (Signed)

## 2016-08-14 NOTE — ED Provider Notes (Signed)
[facility] Emergency Department Provider Note  ____________________________________________  Time seen: Approximately 1:11 PM  I have reviewed the triage vital signs and the nursing notes.   HISTORY  Chief Complaint Chest Pain   HPI Lydia Miller is a 26 y.o. female with h/o Lupus on Benlysta infusions and stroke in 2010 who presents for evaluation of chest pain. Patient reports one episode last week and to overnight that she describes as a dull mild ache located in the center of her chest, constant and nonradiating, lasting 5 minutes at a time. She had one episode at 10 PM last night and one at 2 AM that woke her up from her sleep. No shortness of breath, no diaphoresis, no nausea, no vomiting, no fever, no cough. She has no pain at this time. In one of the episodes she also had a burning sensation in her left arm. No chest pain since 2 AM last night. She is not a smoker. No personal or family history of blood clots, no recent travel or immobilization, no exogenous hormones, no hemoptysis, no leg pain or swelling  Past Medical History:  Diagnosis Date  . Lupus   . Stroke [facility])     There are no active problems to display for this patient.   Past Surgical History:  Procedure Laterality Date  . HERNIA REPAIR      Prior to Admission medications   Medication Sig Start Date End Date Taking? Authorizing Provider  aspirin EC 81 MG tablet Take 81 mg by mouth daily.    [provider]  hydroxychloroquine (PLAQUENIL) 200 MG tablet Take 200 mg by mouth 2 (two) times daily.    [provider]  meclizine (ANTIVERT) 25 MG tablet Take 1 tablet (25 mg total) by mouth 3 (three) times daily as needed for dizziness. 03/02/16   Loleta Rose, MD  meloxicam (MOBIC) 15 MG tablet Take 1 tablet (15 mg total) by mouth daily. 05/20/15   Chinita Pester, FNP    Allergies Piroxicam  Family History Alzheimer's disease Maternal Grandmother    Cancer  Maternal Grandmother        Social History Social History  Substance Use Topics  . Smoking status: Never Smoker  . Smokeless tobacco: Never Used  . Alcohol use No    Review of Systems  Constitutional: Negative for fever. Eyes: Negative for visual changes. ENT: Negative for sore throat. Neck: No neck pain  Cardiovascular: + chest pain. Respiratory: Negative for shortness of breath. Gastrointestinal: Negative for abdominal pain, vomiting or diarrhea. Genitourinary: Negative for dysuria. Musculoskeletal: Negative for back pain. Skin: Negative for rash. Neurological: Negative for headaches, weakness or numbness. Psych: No SI or HI  ____________________________________________   PHYSICAL EXAM:  VITAL SIGNS: ED Triage Vitals [08/14/16 1100]  Enc Vitals Group     BP (!) 107/49     Pulse Rate 77     Resp 18     Temp 98.6 F (37 C)     Temp Source Oral     SpO2 100 %     Weight 178 lb (80.7 kg)     Height 5\' 7"  (1.702 m)     Head Circumference      Peak Flow      Pain Score 3     Pain Loc      Pain Edu?      Excl. in GC?     Constitutional: Alert and oriented. Well appearing and in no apparent distress. HEENT:  Head: Normocephalic and atraumatic.         Eyes: Conjunctivae are normal. Sclera is non-icteric.       Mouth/Throat: Mucous membranes are moist.       Neck: Supple with no signs of meningismus. Cardiovascular: Regular rate and rhythm. No murmurs, gallops, or rubs. 2+ symmetrical distal pulses are present in all extremities. No JVD. Respiratory: Normal respiratory effort. Lungs are clear to auscultation bilaterally. No wheezes, crackles, or rhonchi.  Gastrointestinal: Soft, non tender, and non distended with positive bowel sounds. No rebound or guarding. Genitourinary: No CVA tenderness. Musculoskeletal: Nontender with normal range of motion in all extremities. No edema, cyanosis, or erythema of extremities. Neurologic: Normal speech and language.  Face is symmetric. Moving all extremities. No gross focal neurologic deficits are appreciated. Skin: Skin is warm, dry and intact. No rash noted. Psychiatric: Mood and affect are normal. Speech and behavior are normal.  ____________________________________________   LABS (all labs ordered are listed, but only abnormal results are displayed)  Labs Reviewed  BASIC METABOLIC PANEL - Abnormal; Notable for the following:       Result Value   Glucose, Bld 102 (*)    Calcium 8.7 (*)    All other components within normal limits  CBC - Abnormal; Notable for the following:    Hemoglobin 11.9 (*)    MCV 76.0 (*)    MCH 25.0 (*)    RDW 15.2 (*)    All other components within normal limits  TROPONIN I  TROPONIN I  POCT PREGNANCY, URINE   ____________________________________________  EKG  ED ECG REPORT I, Nita Sicklearolina Batu Cassin, the attending physician, personally viewed and interpreted this ECG.  Normal sinus rhythm, rate of 71, normal intervals, normal axis, no ST elevations or depressions.  13:24 - normal sinus rhythm, rate of 64, normal intervals, right axis deviation, no ST elevations or depressions. ____________________________________________  RADIOLOGY  CXR: Negative.  ____________________________________________   PROCEDURES  Procedure(s) performed: None Procedures Critical Care performed:  None ____________________________________________   INITIAL IMPRESSION / ASSESSMENT AND PLAN / ED COURSE   26 y.o. female with h/o Lupus on Benlysta infusions and stroke in 2010 who presents for evaluation of two episodes of chest pain last night. No pain since 2AM.   ----------------------------------------- 1:20 PM on 08/14/2016 -----------------------------------------   OBSERVATION CARE: This patient is being placed under observation care for the following reasons: Chest pain with repeat testing to rule out ischemia   Chest pain in a 26 y.o. female with low suspicion for  cardiac (HEART score 2) or other serious etiology (including aortic dissection, pneumonia, pneumothorax, or pulmonary embolism) based her history and physical exam in the ED today. EKG normal. Plan for labs including CBC, chemistries and troponin now and in 3 hours, CXR and re-evaluation for disposition. Will give full dose ASA . Will observe patient on cardiac monitor while in the ED and pain control.     ----------------------------------------- 1:59 PM on 08/14/2016 ----------------------------------------- Patient remains pain free. Telemetry with no acute changes. Second troponin is pending.  ----------------------------------------- 3:10 PM on 08/14/2016 -----------------------------------------   END OF OBSERVATION STATUS: After an appropriate period of observation, this patient is being discharged due to the following reason(s):  Second troponin negative. Patient remains asymptomatic. Patient was referred to Dr. Welton FlakesKhan cardiology for further evaluation. Recommended return to the emergency room if she develops new chest pain. Patient is in agreement.      Pertinent labs & imaging results that were available during my care of the  patient were reviewed by me and considered in my medical decision making (see chart for details).    ____________________________________________   FINAL CLINICAL IMPRESSION(S) / ED DIAGNOSES  Final diagnoses:  Chest pain, unspecified type      NEW MEDICATIONS STARTED DURING THIS VISIT:  New Prescriptions   No medications on file     Note:  This document was prepared using Dragon voice recognition software and may include unintentional dictation errors.    Don PerkingVeronese, [place], MD 08/14/16 [phone]

## 2016-10-08 ENCOUNTER — Other Ambulatory Visit: Payer: Self-pay | Admitting: Neurology

## 2016-10-08 DIAGNOSIS — R42 Dizziness and giddiness: Secondary | ICD-10-CM

## 2016-10-15 ENCOUNTER — Ambulatory Visit
Admission: RE | Admit: 2016-10-15 | Discharge: 2016-10-15 | Disposition: A | Discharge status: Home / Self Care | Payer: BLUE CROSS/BLUE SHIELD | Source: Ambulatory Visit | Attending: Neurology | Admitting: Neurology

## 2016-10-15 ENCOUNTER — Ambulatory Visit: Payer: BLUE CROSS/BLUE SHIELD

## 2016-10-15 DIAGNOSIS — R42 Dizziness and giddiness: Secondary | ICD-10-CM

## 2016-10-15 DIAGNOSIS — Z8673 Personal history of transient ischemic attack (TIA), and cerebral infarction without residual deficits: Secondary | ICD-10-CM | POA: Diagnosis not present

## 2016-10-15 MED ORDER — GADOBENATE DIMEGLUMINE 529 MG/ML IV SOLN
15.0000 mL | Freq: Once | INTRAVENOUS | Status: AC | PRN
  Administered 2016-10-15: 15 mL via INTRAVENOUS

## 2016-10-28 IMAGING — CR DG CHEST 2V
1 series · 3 of 3 positions shown · non-contrast
Comparison: 04/26/2015

CLINICAL DATA: Pleuritic right-sided chest pain, onset today.

EXAM:
CHEST  2 VIEW

[Series 1: w chest pa · 0.14mm/px · 3 of 3 slices shown]
[im 1/3]
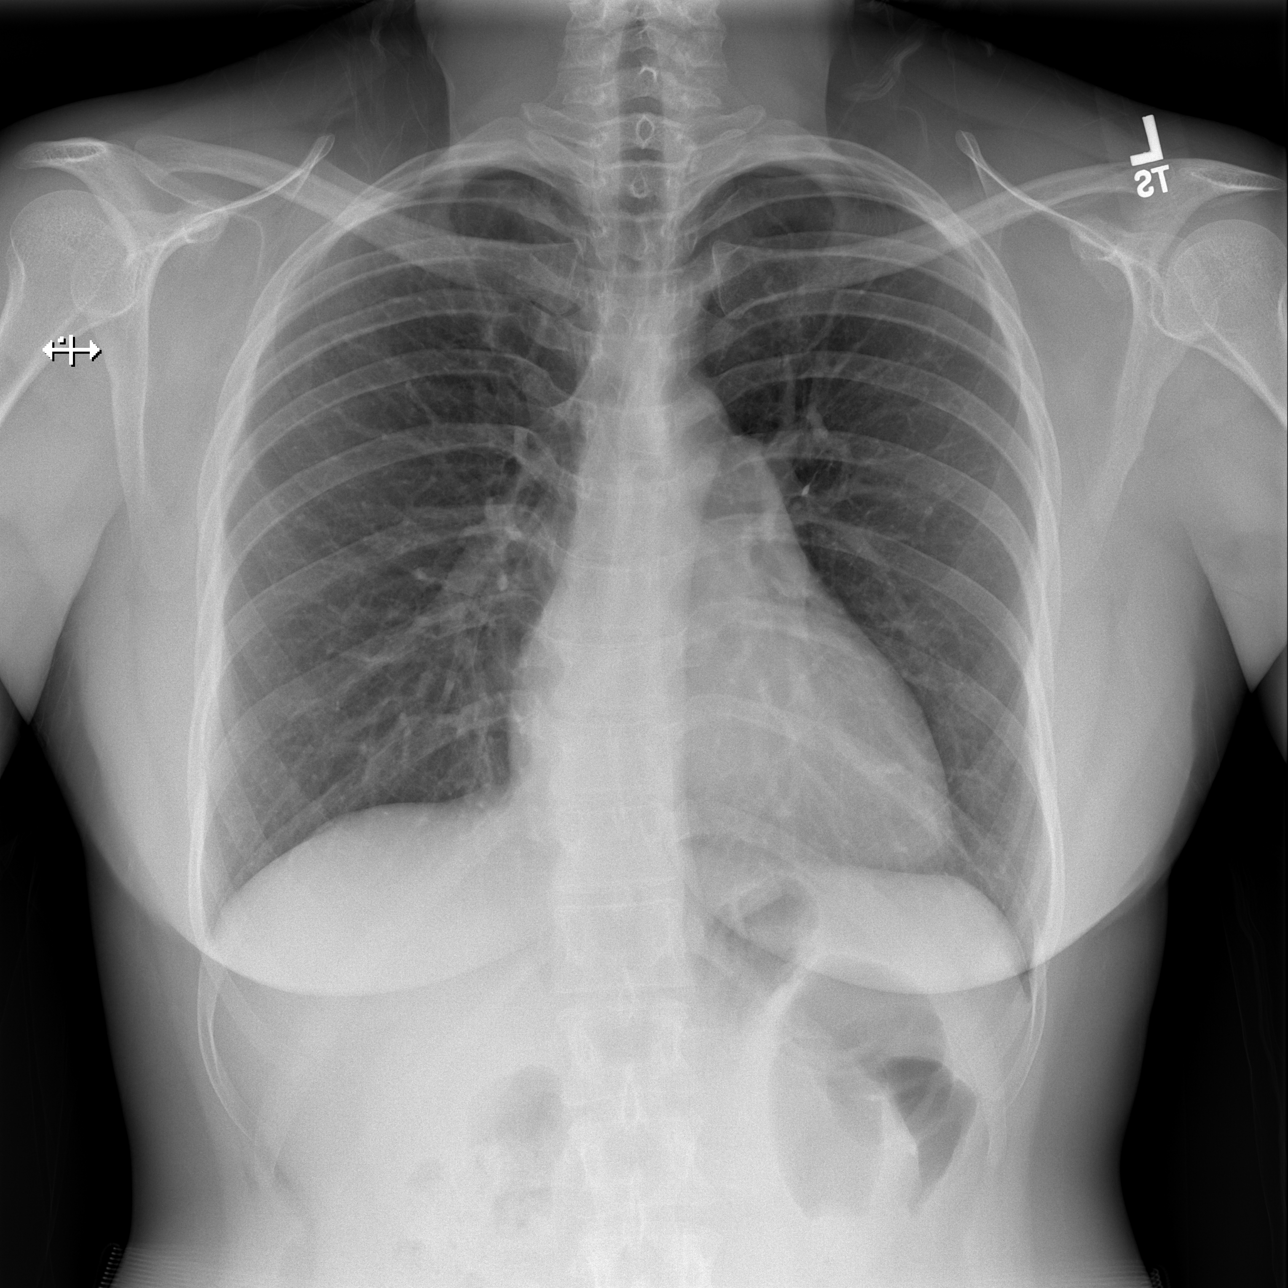
[im 2/3]
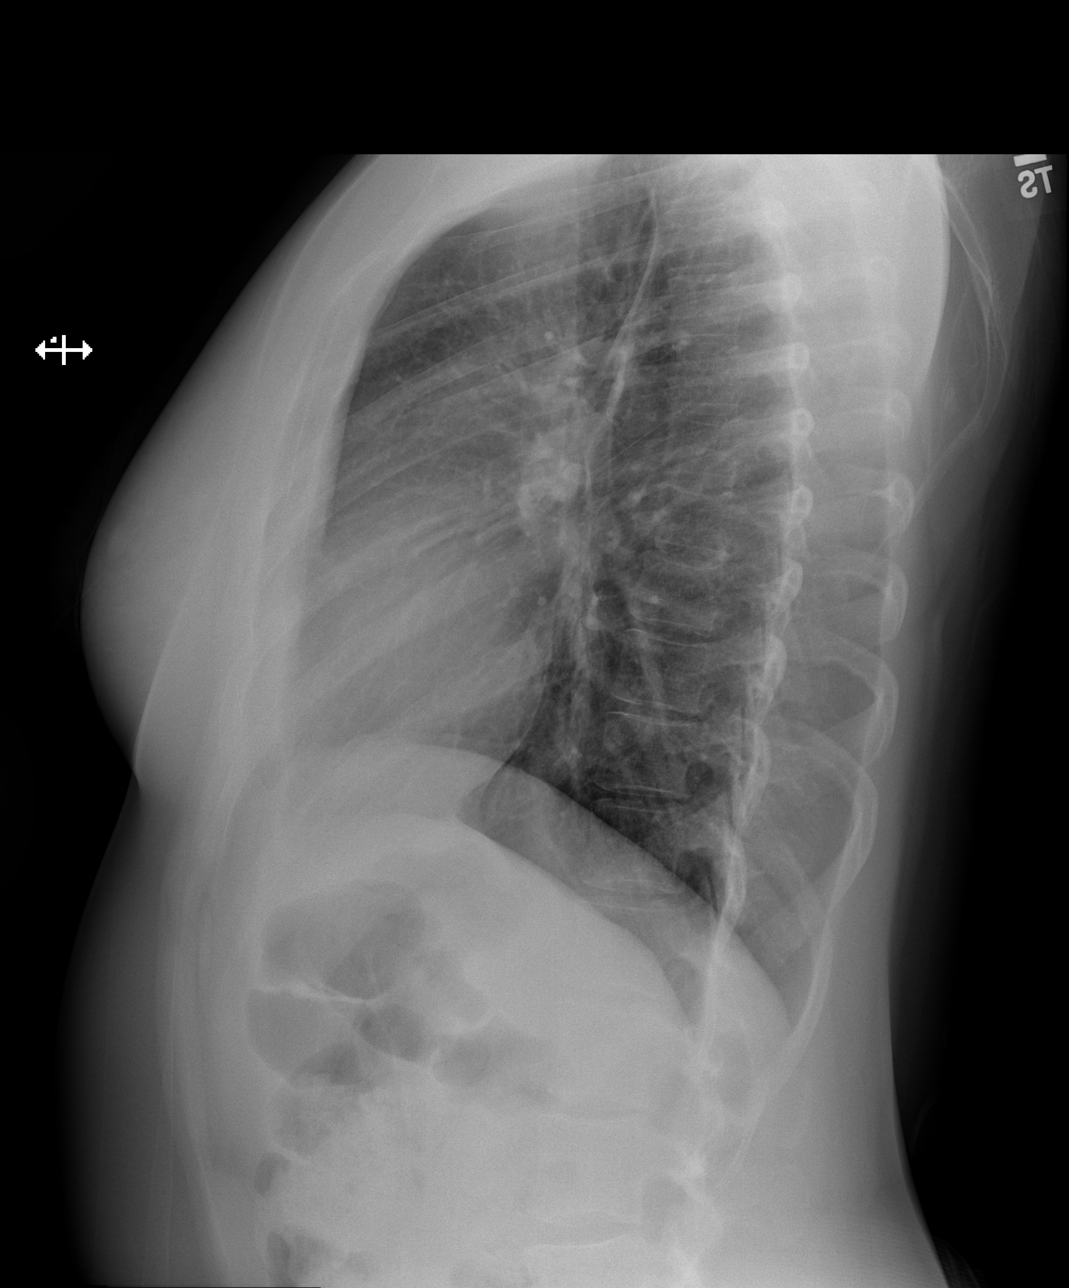
[im 3/3]
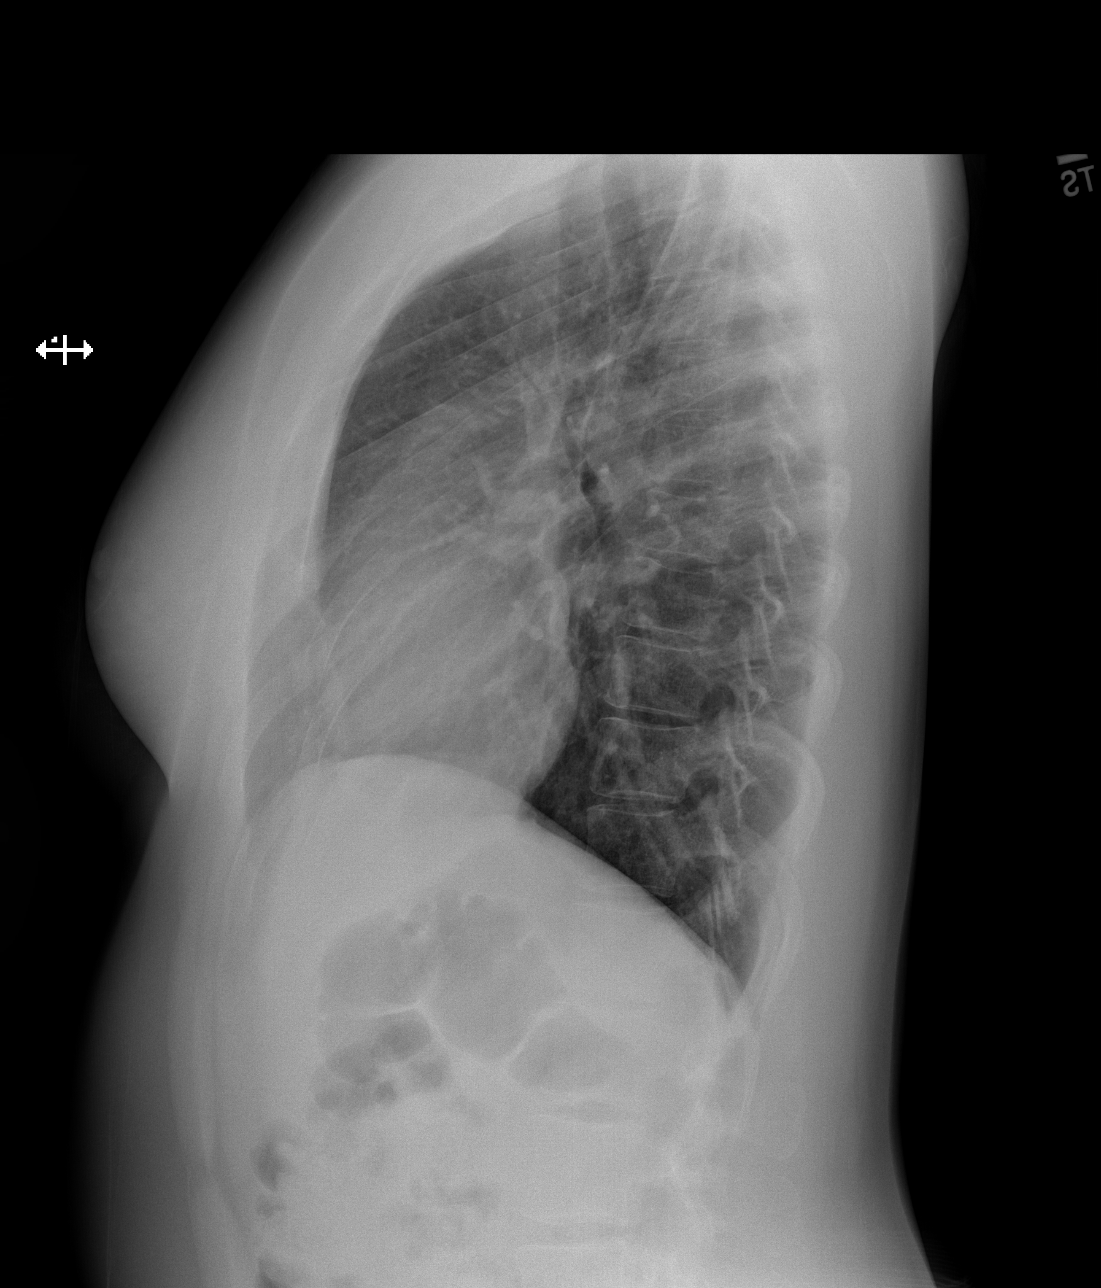

[3 of 3 positions shown; findings below may reference images not displayed]

FINDINGS: The lungs are clear. The pulmonary vasculature is normal. Heart size
is normal. Hilar and mediastinal contours are unremarkable. There is
no pleural effusion.
IMPRESSION: No active cardiopulmonary disease.

## 2017-01-25 ENCOUNTER — Other Ambulatory Visit: Payer: Self-pay | Admitting: Neurology

## 2017-01-25 DIAGNOSIS — G932 Benign intracranial hypertension: Secondary | ICD-10-CM

## 2017-01-25 DIAGNOSIS — H9313 Tinnitus, bilateral: Secondary | ICD-10-CM

## 2017-02-07 ENCOUNTER — Ambulatory Visit: Admission: RE | Admit: 2017-02-07 | Payer: BLUE CROSS/BLUE SHIELD | Source: Ambulatory Visit

## 2017-03-21 ENCOUNTER — Ambulatory Visit
Admission: RE | Admit: 2017-03-21 | Discharge: 2017-03-21 | Disposition: A | Discharge status: Home / Self Care | Payer: BLUE CROSS/BLUE SHIELD | Source: Ambulatory Visit | Attending: Family Medicine | Admitting: Family Medicine

## 2017-03-21 ENCOUNTER — Other Ambulatory Visit: Payer: Self-pay | Admitting: Family Medicine

## 2017-03-21 DIAGNOSIS — R109 Unspecified abdominal pain: Secondary | ICD-10-CM

## 2017-03-21 DIAGNOSIS — R110 Nausea: Secondary | ICD-10-CM | POA: Insufficient documentation

## 2017-03-21 DIAGNOSIS — R1031 Right lower quadrant pain: Secondary | ICD-10-CM | POA: Insufficient documentation

## 2017-03-21 DIAGNOSIS — R93421 Abnormal radiologic findings on diagnostic imaging of right kidney: Secondary | ICD-10-CM | POA: Diagnosis not present

## 2017-03-21 HISTORY — DX: Systemic involvement of connective tissue, unspecified: M35.9

## 2017-03-21 MED ORDER — IOPAMIDOL (ISOVUE-300) INJECTION 61%
100.0000 mL | Freq: Once | INTRAVENOUS | Status: AC | PRN
  Administered 2017-03-21: 100 mL via INTRAVENOUS

## 2017-03-31 ENCOUNTER — Encounter: Payer: Self-pay | Admitting: Emergency Medicine

## 2017-03-31 ENCOUNTER — Emergency Department
Admission: EM | Admit: 2017-03-31 | Discharge: 2017-04-01 | Disposition: A | Discharge status: Home / Self Care | Payer: BLUE CROSS/BLUE SHIELD | Attending: Emergency Medicine | Admitting: Emergency Medicine

## 2017-03-31 ENCOUNTER — Other Ambulatory Visit: Payer: Self-pay

## 2017-03-31 DIAGNOSIS — R509 Fever, unspecified: Secondary | ICD-10-CM | POA: Diagnosis present

## 2017-03-31 DIAGNOSIS — R8271 Bacteriuria: Secondary | ICD-10-CM | POA: Diagnosis not present

## 2017-03-31 DIAGNOSIS — Z8673 Personal history of transient ischemic attack (TIA), and cerebral infarction without residual deficits: Secondary | ICD-10-CM | POA: Insufficient documentation

## 2017-03-31 DIAGNOSIS — Z7982 Long term (current) use of aspirin: Secondary | ICD-10-CM | POA: Diagnosis not present

## 2017-03-31 DIAGNOSIS — Z79899 Other long term (current) drug therapy: Secondary | ICD-10-CM | POA: Insufficient documentation

## 2017-03-31 DIAGNOSIS — R05 Cough: Secondary | ICD-10-CM | POA: Insufficient documentation

## 2017-03-31 LAB — CBC WITH DIFFERENTIAL/PLATELET
BASOS ABS: 0 10*3/uL (ref 0–0.1)
BASOS PCT: 1 %
EOS PCT: 0 %
Eosinophils Absolute: 0 10*3/uL (ref 0–0.7)
HCT: 38.9 % (ref 35.0–47.0)
Hemoglobin: 12.6 g/dL (ref 12.0–16.0)
Lymphocytes Relative: 10 %
Lymphs Abs: 0.6 10*3/uL — ABNORMAL LOW (ref 1.0–3.6)
MCH: 24.3 pg — ABNORMAL LOW (ref 26.0–34.0)
MCHC: 32.3 g/dL (ref 32.0–36.0)
MCV: 75.1 fL — ABNORMAL LOW (ref 80.0–100.0)
MONO ABS: 0.4 10*3/uL (ref 0.2–0.9)
Monocytes Relative: 6 %
Neutro Abs: 5.2 10*3/uL (ref 1.4–6.5)
Neutrophils Relative %: 83 %
PLATELETS: 347 10*3/uL (ref 150–440)
RBC: 5.18 MIL/uL (ref 3.80–5.20)
RDW: 15.4 % — AB (ref 11.5–14.5)
WBC: 6.2 10*3/uL (ref 3.6–11.0)

## 2017-03-31 LAB — URINALYSIS, COMPLETE (UACMP) WITH MICROSCOPIC
BILIRUBIN URINE: NEGATIVE
GLUCOSE, UA: NEGATIVE mg/dL
HGB URINE DIPSTICK: NEGATIVE
Ketones, ur: NEGATIVE mg/dL
LEUKOCYTES UA: NEGATIVE
Nitrite: NEGATIVE
PROTEIN: NEGATIVE mg/dL
Specific Gravity, Urine: 1.003 — ABNORMAL LOW (ref 1.005–1.030)
pH: 6 (ref 5.0–8.0)

## 2017-03-31 LAB — BASIC METABOLIC PANEL
ANION GAP: 6 (ref 5–15)
BUN: 7 mg/dL (ref 6–20)
CALCIUM: 8.9 mg/dL (ref 8.9–10.3)
CO2: 25 mmol/L (ref 22–32)
CREATININE: 0.99 mg/dL (ref 0.44–1.00)
Chloride: 106 mmol/L (ref 101–111)
GLUCOSE: 95 mg/dL (ref 65–99)
Potassium: 3.9 mmol/L (ref 3.5–5.1)
Sodium: 137 mmol/L (ref 135–145)

## 2017-03-31 LAB — POCT PREGNANCY, URINE: Preg Test, Ur: NEGATIVE

## 2017-03-31 NOTE — ED Notes (Signed)
Informed RN that patient has been roomed and is ready for evaluation.  Patient in NAD at this time and call bell placed within reach.   

## 2017-03-31 NOTE — ED Triage Notes (Signed)
Patient ambulatory to triage with steady gait, without difficulty or distress noted; pt reports dx with kidney infection 3/1; last 2 days having fever, lower back despite currently taking cipro

## 2017-04-01 ENCOUNTER — Emergency Department: Payer: BLUE CROSS/BLUE SHIELD

## 2017-04-01 LAB — CHLAMYDIA/NGC RT PCR (ARMC ONLY)
Chlamydia Tr: NOT DETECTED
N gonorrhoeae: NOT DETECTED

## 2017-04-01 LAB — INFLUENZA PANEL BY PCR (TYPE A & B)
INFLBPCR: NEGATIVE
Influenza A By PCR: NEGATIVE

## 2017-04-01 MED ORDER — SODIUM CHLORIDE 0.9 % IV SOLN
1.0000 g | Freq: Once | INTRAVENOUS | Status: AC
  Administered 2017-04-01: 1 g via INTRAVENOUS
  Filled 2017-04-01: qty 10

## 2017-04-01 MED ORDER — IOPAMIDOL (ISOVUE-370) INJECTION 76%
75.0000 mL | Freq: Once | INTRAVENOUS | Status: AC | PRN
  Administered 2017-04-01: 75 mL via INTRAVENOUS

## 2017-04-01 NOTE — ED Provider Notes (Signed)
[facility] Emergency Department Provider Note    First MD Initiated Contact with Patient 03/31/17 2358     (approximate)  I have reviewed the triage vital signs and the nursing notes.   HISTORY  Chief Complaint Fever and Back Pain    HPI Lydia Miller is a 27 y.o. female presents to the emergency department with multiple medical complaints.  Patient admits to being diagnosed with a kidney infection on 03/18/2017 however she admits to continued back pain, subjective fever and intermittent burning sensation at her external urethral meatus.  Patient also admits to cough and dyspnea.  Patient also admits to right axilla tenderness  Past Medical History:  Diagnosis Date  . Collagen vascular disease (HCC)    lupus  . Lupus   . Stroke [facility])     There are no active problems to display for this patient.   Past Surgical History:  Procedure Laterality Date  . HERNIA REPAIR      Prior to Admission medications   Medication Sig Start Date End Date Taking? Authorizing Provider  aspirin EC 81 MG tablet Take 81 mg by mouth daily.    [provider]  hydroxychloroquine (PLAQUENIL) 200 MG tablet Take 200 mg by mouth 2 (two) times daily.    [provider]  meclizine (ANTIVERT) 25 MG tablet Take 1 tablet (25 mg total) by mouth 3 (three) times daily as needed for dizziness. 03/02/16   Loleta Rose, MD  meloxicam (MOBIC) 15 MG tablet Take 1 tablet (15 mg total) by mouth daily. 05/20/15   Triplett, Rulon Eisenmenger B, FNP    Allergies Piroxicam  No family history on file.  Social History Social History   Tobacco Use  . Smoking status: Never Smoker  . Smokeless tobacco: Never Used  Substance Use Topics  . Alcohol use: No  . Drug use: Not on file    Review of Systems Constitutional: Positive for subjective fever/chills Eyes: No visual changes. ENT: No sore throat. Cardiovascular: Denies chest pain. Respiratory: Denies shortness of breath.  Positive  for cough  gastrointestinal: No abdominal pain.  No nausea, no vomiting.  No diarrhea.  No constipation. Genitourinary: Negative for dysuria. Musculoskeletal: Negative for neck pain.  Negative for back pain. Integumentary: Negative for rash. Neurological: Negative for headaches, focal weakness or numbness.  ____________________________________________   PHYSICAL EXAM:  VITAL SIGNS: ED Triage Vitals  Enc Vitals Group     BP 03/31/17 2029 137/80     Pulse Rate 03/31/17 2029 (!) 110     Resp 03/31/17 2029 18     Temp 03/31/17 2029 99.5 F (37.5 C)     Temp Source 03/31/17 2029 Oral     SpO2 03/31/17 2029 99 %     Weight 03/31/17 2023 90.7 kg (200 lb)     Height 03/31/17 2023 1.702 m (5\' 7" )     Head Circumference --      Peak Flow --      Pain Score 03/31/17 2022 5     Pain Loc --      Pain Edu? --      Excl. in [place]? --     Constitutional: Alert and oriented. Well appearing and in no acute distress. Eyes: Conjunctivae are normal.  Head: Atraumatic. Nose: No congestion/rhinnorhea. Mouth/Throat: Mucous membranes are moist.  Oropharynx non-erythematous. Neck: No stridor.  No meningeal signs.   Cardiovascular: Normal rate, regular rhythm. Good peripheral circulation. Grossly normal heart sounds. Respiratory: Normal respiratory effort.  No  retractions. Lungs CTAB. Gastrointestinal: Soft and nontender. No distention.  Musculoskeletal: No lower extremity tenderness nor edema. No gross deformities of extremities. Neurologic:  Normal speech and language. No gross focal neurologic deficits are appreciated.  Skin:  Skin is warm, dry and intact. No rash noted. Psychiatric: Anxious affect. Speech and behavior are normal.  ____________________________________________   LABS (all labs ordered are listed, but only abnormal results are displayed)  Labs Reviewed  URINALYSIS, COMPLETE (UACMP) WITH MICROSCOPIC - Abnormal; Notable for the following components:      Result Value   Color,  Urine STRAW (*)    APPearance CLEAR (*)    Specific Gravity, Urine 1.003 (*)    Bacteria, UA MANY (*)    Squamous Epithelial / LPF 0-5 (*)    All other components within normal limits  CBC WITH DIFFERENTIAL/PLATELET - Abnormal; Notable for the following components:   MCV 75.1 (*)    MCH 24.3 (*)    RDW 15.4 (*)    Lymphs Abs 0.6 (*)    All other components within normal limits  CHLAMYDIA/NGC RT PCR ([facility] ONLY)  URINE CULTURE  BASIC METABOLIC PANEL  INFLUENZA PANEL BY PCR (TYPE A & B)  POCT PREGNANCY, URINE    RADIOLOGY I, Hill N BROWN, personally viewed and evaluated these images (plain radiographs) as part of my medical decision making, as well as reviewing the written report by the radiologist.    Official radiology report(s): Dg Chest 2 View  Result Date: 04/01/2017 CLINICAL DATA:  27 year old female with cough. EXAM: CHEST - 2 VIEW COMPARISON:  Chest radiograph dated 08/14/2016 FINDINGS: The heart size and mediastinal contours are within normal limits. Both lungs are clear. The visualized skeletal structures are unremarkable. IMPRESSION: No active cardiopulmonary disease. Electronically Signed   By: Elgie CollardArash  Radparvar M.D.   On: 04/01/2017 00:53   Ct Angio Chest Pe W And/or Wo Contrast  Result Date: 04/01/2017 CLINICAL DATA:  Fever and low back pain. EXAM: CT ANGIOGRAPHY CHEST WITH CONTRAST TECHNIQUE: Multidetector CT imaging of the chest was performed using the standard protocol during bolus administration of intravenous contrast. Multiplanar CT image reconstructions and MIPs were obtained to evaluate the vascular anatomy. CONTRAST:  75mL ISOVUE-370 IOPAMIDOL (ISOVUE-370) INJECTION 76% COMPARISON:  Chest radiograph 04/01/2017 Chest CT 04/29/2015 FINDINGS: Cardiovascular: Contrast injection is sufficient to demonstrate satisfactory opacification of the pulmonary arteries to the segmental level. There is no pulmonary embolus. The main pulmonary artery is within normal  limits for size. There is a normal variant arch branching pattern without evidence of acute aortic syndrome. There is noaortic atherosclerosis. Heart size is normal, without pericardial effusion. Mediastinum/Nodes: Enlarged right axillary lymph node measures 2.3 cm. Multiple other mildly enlarged axillary nodes on the right. No mediastinal adenopathy. Lungs/Pleura: No pulmonary nodules or masses. No pleural effusion or pneumothorax. No focal airspace consolidation. No focal pleural abnormality. Upper Abdomen: Contrast bolus timing is not optimized for evaluation of the abdominal organs. Within this limitation, the visualized organs of the upper abdomen are normal. Musculoskeletal: No chest wall abnormality. No acute or significant osseous findings. Review of the MIP images confirms the above findings. IMPRESSION: 1. No pulmonary embolus. 2. Right axillary lymphadenopathy, of uncertain etiology. Electronically Signed   By: Deatra RobinsonKevin  Herman M.D.   On: 04/01/2017 02:30    ____________________________________________    Procedures   ____________________________________________   INITIAL IMPRESSION / ASSESSMENT AND PLAN / ED COURSE  As part of my medical decision making, I reviewed the following data within the electronic  MEDICAL RECORD NUMBER  27 year old female presenting to the emergency department with above-stated history and physical exam.  Concern for possible influenza however this was negative concern for possible pyelonephritis however urinalysis revealed many bacteria but no other positive findings.  As such urine culture was obtained consider this to be symptomatic bacteria and as such 1 g of ceftriaxone was given IV.  Regarding the patient's cough chest x-ray was performed which revealed no evidence of pneumonia review of the patient's chart revealed pneumonia which was discovered on CT scan in 2017.  Patient does have a history of systemic lupus given dyspnea and cough CT scan of chest performed  which was also negative.  Patient states that she just finished a course of Cipro yesterday and as such her urine may be partially treated.  I scribed antibiotics at this time as I suspect the patient has a viral illness which is the cause of her URI symptoms.  Regarding the patient's urine I encouraged the patient to have Dr. Thedore Mins her primary care provider follow-up with the urine culture ____________________________________________  FINAL CLINICAL IMPRESSION(S) / ED DIAGNOSES  Final diagnoses:  Bacteriuria     MEDICATIONS GIVEN DURING THIS VISIT:  Medications  iopamidol (ISOVUE-370) 76 % injection 75 mL (75 mLs Intravenous Contrast Given 04/01/17 0202)  cefTRIAXone (ROCEPHIN) 1 g in sodium chloride 0.9 % 100 mL IVPB (0 g Intravenous Stopped 04/01/17 0525)     ED Discharge Orders    None       Note:  This document was prepared using Dragon voice recognition software and may include unintentional dictation errors.    Darci Current, MD 04/01/17 [phone]

## 2017-04-01 NOTE — ED Notes (Signed)
Patient transported to X-ray 

## 2017-04-02 LAB — URINE CULTURE
CULTURE: NO GROWTH
SPECIAL REQUESTS: NORMAL

## 2018-10-24 ENCOUNTER — Other Ambulatory Visit: Payer: Self-pay | Admitting: Family Medicine

## 2018-10-24 DIAGNOSIS — N644 Mastodynia: Secondary | ICD-10-CM

## 2018-11-02 ENCOUNTER — Ambulatory Visit
Admission: RE | Admit: 2018-11-02 | Discharge: 2018-11-02 | Disposition: A | Discharge status: Home / Self Care | Payer: BC Managed Care – PPO | Source: Ambulatory Visit | Attending: Family Medicine | Admitting: Family Medicine

## 2018-11-02 DIAGNOSIS — N644 Mastodynia: Secondary | ICD-10-CM | POA: Insufficient documentation

## 2018-11-07 ENCOUNTER — Other Ambulatory Visit: Payer: Self-pay | Admitting: Family Medicine

## 2018-11-07 DIAGNOSIS — R928 Other abnormal and inconclusive findings on diagnostic imaging of breast: Secondary | ICD-10-CM

## 2018-11-15 ENCOUNTER — Ambulatory Visit
Admission: RE | Admit: 2018-11-15 | Discharge: 2018-11-15 | Disposition: A | Discharge status: Home / Self Care | Payer: BC Managed Care – PPO | Source: Ambulatory Visit | Attending: Family Medicine | Admitting: Family Medicine

## 2018-11-15 DIAGNOSIS — R928 Other abnormal and inconclusive findings on diagnostic imaging of breast: Secondary | ICD-10-CM

## 2018-11-20 ENCOUNTER — Encounter: Payer: Self-pay | Admitting: Diagnostic Radiology

## 2018-11-20 LAB — SURGICAL PATHOLOGY

## 2019-02-23 ENCOUNTER — Emergency Department
Admission: EM | Admit: 2019-02-23 | Discharge: 2019-02-23 | Disposition: A | Discharge status: Home / Self Care | Payer: BC Managed Care – PPO | Attending: Student | Admitting: Student

## 2019-02-23 ENCOUNTER — Emergency Department: Payer: BC Managed Care – PPO

## 2019-02-23 ENCOUNTER — Other Ambulatory Visit: Payer: Self-pay

## 2019-02-23 DIAGNOSIS — Z7982 Long term (current) use of aspirin: Secondary | ICD-10-CM | POA: Diagnosis not present

## 2019-02-23 DIAGNOSIS — M79602 Pain in left arm: Secondary | ICD-10-CM | POA: Insufficient documentation

## 2019-02-23 DIAGNOSIS — R079 Chest pain, unspecified: Secondary | ICD-10-CM

## 2019-02-23 DIAGNOSIS — R0789 Other chest pain: Secondary | ICD-10-CM | POA: Insufficient documentation

## 2019-02-23 DIAGNOSIS — Z79899 Other long term (current) drug therapy: Secondary | ICD-10-CM | POA: Diagnosis not present

## 2019-02-23 LAB — BASIC METABOLIC PANEL
Anion gap: 8 (ref 5–15)
BUN: 10 mg/dL (ref 6–20)
CO2: 23 mmol/L (ref 22–32)
Calcium: 8.8 mg/dL — ABNORMAL LOW (ref 8.9–10.3)
Chloride: 106 mmol/L (ref 98–111)
Creatinine, Ser: 0.93 mg/dL (ref 0.44–1.00)
GFR calc Af Amer: >60 mL/min (ref >60)
GFR calc non Af Amer: >60 mL/min (ref >60)
Glucose, Bld: 98 mg/dL (ref 70–99)
Potassium: 3.8 mmol/L (ref 3.5–5.1)
Sodium: 137 mmol/L (ref 135–145)

## 2019-02-23 LAB — POCT PREGNANCY, URINE: Preg Test, Ur: NEGATIVE

## 2019-02-23 LAB — CBC
HCT: 38.2 % (ref 36.0–46.0)
Hemoglobin: 12.1 g/dL (ref 12.0–15.0)
MCH: 25.7 pg — ABNORMAL LOW (ref 26.0–34.0)
MCHC: 31.7 g/dL (ref 30.0–36.0)
MCV: 81.3 fL (ref 80.0–100.0)
Platelets: 353 10*3/uL (ref 150–400)
RBC: 4.7 MIL/uL (ref 3.87–5.11)
RDW: 14 % (ref 11.5–15.5)
WBC: 7.8 10*3/uL (ref 4.0–10.5)
nRBC: 0 % (ref 0.0–0.2)

## 2019-02-23 LAB — TROPONIN I (HIGH SENSITIVITY)
Troponin I (High Sensitivity): <2 ng/L (ref <18)
Troponin I (High Sensitivity): <2 ng/L (ref <18)

## 2019-02-23 MED ORDER — KETOROLAC TROMETHAMINE 60 MG/2ML IM SOLN: 30.0000 mg | Freq: Once | INTRAMUSCULAR | Status: DC

## 2019-02-23 NOTE — ED Notes (Signed)
Pt states she has hx of TIA 2010 and Lupus dx 2009.

## 2019-02-23 NOTE — ED Triage Notes (Signed)
Patient c/o left chest pain radiating to back and arm. Patient reports hx of stroke at 18.

## 2019-02-23 NOTE — ED Notes (Signed)
Pt transported to US

## 2019-02-23 NOTE — ED Notes (Signed)
Pt returned from [place] and I hooked her back to monitoring. Informed pt that EDP had ordered toradol for her pain. Pt stated she felt like she didn't need pain med at this time. I told pt that was fine but to be sure to call if she changed her mind. I will re-assess.

## 2019-02-23 NOTE — Discharge Instructions (Addendum)
Thank you for letting us take care of you in the emergency department today. Your blood work did not show any evidence that you need to stay in the hospital. Your ultrasound was negative for any blood clots.  Please continue to take any regular, prescribed medications. Try taking scheduled ibuprofen (as long as no allergies or adverse reactions) for the next few days to help with your symptoms, you can take 600 mg every 6 hours for the next few days.  Please follow up with: - Your primary care doctor to review your ER visit and follow up on your symptoms.   Please return to the ER for any new or worsening symptoms.

## 2019-02-23 NOTE — ED Provider Notes (Signed)
[facility] Emergency Department Provider Note  ____________________________________________   First MD Initiated Contact with Patient 02/23/19 [phone]     (approximate)  I have reviewed the triage vital signs and the nursing notes.  History  Chief Complaint Chest Pain    HPI Lydia Miller is a 29 y.o. female with history of lupus, stroke who presents for abnormal left sided upper arm pain/sensation, as well as chest pain.  Patient states over the last several days to week she has been having intermittent episodes of pain in her left upper arm and shoulder area. Sort of a prickling type feeling. Yesterday she began having these pains in her left-sided chest area as well, which is new.  She describes them as a sharp, stinging type sensation.  They seem to come and go intermittently without any identifiable trigger.  Some radiation up to the lateral neck area.  No alleviating or aggravating components.  She does report gaining weight during the pandemic, but is unsure if her left arm is more swollen compared to the right.  No history of DVT.  No recent travel.  No trauma to the arm.  She reports compliance with all her medications.  She denies any fevers, cough, difficulty breathing, sick contacts.  No recent illnesses or infection.   Past Medical Hx Past Medical History:  Diagnosis Date  . Collagen vascular disease (HCC)    lupus  . Lupus (HCC)   . Stroke [facility])     Problem List There are no problems to display for this patient.   Past Surgical Hx Past Surgical History:  Procedure Laterality Date  . HERNIA REPAIR      Medications Prior to Admission medications   Medication Sig Start Date End Date Taking? Authorizing Provider  aspirin EC 81 MG tablet Take 81 mg by mouth daily.    [provider]  hydroxychloroquine (PLAQUENIL) 200 MG tablet Take 200 mg by mouth 2 (two) times daily.    [provider]  meclizine (ANTIVERT) 25 MG tablet  Take 1 tablet (25 mg total) by mouth 3 (three) times daily as needed for dizziness. 03/02/16   Loleta Rose, MD  meloxicam (MOBIC) 15 MG tablet Take 1 tablet (15 mg total) by mouth daily. 05/20/15   Triplett, Rulon Eisenmenger B, FNP    Allergies Piroxicam  Family Hx Family History  Problem Relation Age of Onset  . Breast cancer Maternal Grandmother     Social Hx Social History   Tobacco Use  . Smoking status: Never Smoker  . Smokeless tobacco: Never Used  Substance Use Topics  . Alcohol use: No  . Drug use: Not on file     Review of Systems  Constitutional: Negative for fever, chills. Eyes: Negative for visual changes. ENT: Negative for sore throat. Cardiovascular: + for chest pain. Respiratory: Negative for shortness of breath. Gastrointestinal: Negative for nausea, vomiting.  Genitourinary: Negative for dysuria. Musculoskeletal: Negative for leg swelling. Skin: Negative for rash. Neurological: Negative for headaches.   Physical Exam  Vital Signs: ED Triage Vitals  Enc Vitals Group     BP 02/23/19 0327 130/83     Pulse Rate 02/23/19 0327 74     Resp 02/23/19 0327 18     Temp 02/23/19 0327 98.4 F (36.9 C)     Temp Source 02/23/19 0600 Oral     SpO2 02/23/19 0327 100 %     Weight 02/23/19 0327 216 lb (98 kg)     Height 02/23/19 0327  5\' 7"  (1.702 m)     Head Circumference --      Peak Flow --      Pain Score 02/23/19 0327 4     Pain Loc --      Pain Edu? --      Excl. in [place]? --     Constitutional: Alert and oriented.  Head: Normocephalic. Atraumatic. Eyes: Conjunctivae clear. Sclera anicteric. Nose: No congestion. No rhinorrhea. Mouth/Throat: Wearing mask.  Neck: No stridor.   Cardiovascular: Normal rate, regular rhythm. Extremities well perfused.  2+ symmetric radial pulses by palpation.  Cap refill less than 2 seconds. Respiratory: Normal respiratory effort.  Lungs CTAB. Gastrointestinal: Soft. Non-tender. Non-distended.  Musculoskeletal: No lower extremity  edema. No deformities.  No obvious edema, asymmetry, or swelling to the bilateral upper extremities. Upper arm is NT to palpation. No erythema, warmth, or fluctuance.  FROM bilateral shoulders, elbows, wrists. Neurologic:  Normal speech and language. No gross focal neurologic deficits are appreciated.  Motor/sensation intact in bilateral radial, median, ulnar distribution. Skin: Skin is warm, dry and intact. No rash noted. Psychiatric: Mood and affect are appropriate for situation.  EKG  Personally reviewed.   Rate: 77 Rhythm: sinus Axis: normal Intervals: WNL No STEMI    Radiology  CXR:  IMPRESSION:  Stable cardiomegaly. No acute cardiopulmonary abnormality.   [place] LUE:  IMPRESSION: No evidence of DVT within the left upper extremity.     Procedures  Procedure(s) performed (including critical care):  Procedures   Initial Impression / Assessment and Plan / ED Course  29 y.o. female who presents to the ED for chest pain, as above. Hx lupus, collagen vascular disease, stroke.   Ddx: ACS, pleurisy, DVT, pericarditis.  No evidence of systemic toxicity or hemodynamic compromise to suggest myocarditis.  Bedside ultrasound demonstrates no significant pericardial effusion, no evidence of tamponade.  With regards to left arm findings, consider DVT given her history of lupus.  No evidence on exam of infection or arterial compromise.  Work-up initiated in triage reveals troponin negative x 2.  No acute findings on CXR.  EKG without acute ischemic changes, no EKG findings of pericarditis.  Will additionally obtain an ultrasound of the LUE to rule out DVT given her medical history of lupus.  Ultrasound negative for DVT.  As such, patient stable for discharge with outpatient follow-up.  Advised supportive care with NSAIDs as needed.  Patient agreeable.  Given return precautions.   Final Clinical Impression(s) / ED Diagnosis  Final diagnoses:  Chest pain in adult  Left arm pain        Note:  This document was prepared using Dragon voice recognition software and may include unintentional dictation errors.   Lilia Pro., MD 02/23/19 [phone]

## 2019-04-24 ENCOUNTER — Other Ambulatory Visit: Payer: Self-pay | Admitting: Family Medicine

## 2019-04-24 DIAGNOSIS — R635 Abnormal weight gain: Secondary | ICD-10-CM

## 2019-04-24 DIAGNOSIS — R1010 Upper abdominal pain, unspecified: Secondary | ICD-10-CM

## 2019-05-24 ENCOUNTER — Ambulatory Visit: Payer: BC Managed Care – PPO | Attending: Family Medicine

## 2019-07-13 DIAGNOSIS — Y9389 Activity, other specified: Secondary | ICD-10-CM | POA: Diagnosis not present

## 2019-07-13 DIAGNOSIS — S61217A Laceration without foreign body of left little finger without damage to nail, initial encounter: Secondary | ICD-10-CM | POA: Diagnosis present

## 2019-07-13 DIAGNOSIS — W268XXA Contact with other sharp object(s), not elsewhere classified, initial encounter: Secondary | ICD-10-CM | POA: Insufficient documentation

## 2019-07-13 DIAGNOSIS — Y998 Other external cause status: Secondary | ICD-10-CM | POA: Diagnosis not present

## 2019-07-13 DIAGNOSIS — Z7982 Long term (current) use of aspirin: Secondary | ICD-10-CM | POA: Insufficient documentation

## 2019-07-13 DIAGNOSIS — Y9289 Other specified places as the place of occurrence of the external cause: Secondary | ICD-10-CM | POA: Diagnosis not present

## 2019-07-13 NOTE — ED Triage Notes (Signed)
Patient received laceration from a new set of wine glasses she was trying to free from their packaging. Bleeding controlled at this time.

## 2019-07-14 ENCOUNTER — Emergency Department
Admission: EM | Admit: 2019-07-14 | Discharge: 2019-07-14 | Disposition: A | Discharge status: Home / Self Care | Payer: BC Managed Care – PPO | Attending: Emergency Medicine | Admitting: Emergency Medicine

## 2019-07-14 DIAGNOSIS — S61217A Laceration without foreign body of left little finger without damage to nail, initial encounter: Secondary | ICD-10-CM

## 2019-07-14 NOTE — ED Notes (Addendum)
Pt's finger wound dressed per EDP order by this RN and Gregor Hams.

## 2019-07-14 NOTE — Discharge Instructions (Signed)
Return to the ER for worsening symptoms, increased redness/swelling, purulent discharge or other concerns 

## 2019-07-14 NOTE — ED Provider Notes (Signed)
[facility] Emergency Department Provider Note   ____________________________________________   First MD Initiated Contact with Patient 07/14/19 [phone]     (approximate)  I have reviewed the triage vital signs and the nursing notes.   HISTORY  Chief Complaint Laceration (right ring finger, flap laceration on fingertip)    HPI [place] D Whitfill is a 29 y.o. female who presents to the ED from home with a chief complaint of finger laceration.  Patient was trying to box a set of wine glasses when it cut her right fourth digit finger pad.  Tetanus is not up-to-date.  Voices no other complaints or injuries.       Past Medical History:  Diagnosis Date  . Collagen vascular disease (HCC)    lupus  . Lupus (HCC)   . Stroke [facility])     There are no problems to display for this patient.   Past Surgical History:  Procedure Laterality Date  . HERNIA REPAIR      Prior to Admission medications   Medication Sig Start Date End Date Taking? Authorizing Provider  aspirin EC 81 MG tablet Take 81 mg by mouth daily.    [provider]  hydroxychloroquine (PLAQUENIL) 200 MG tablet Take 200 mg by mouth 2 (two) times daily.    [provider]  meclizine (ANTIVERT) 25 MG tablet Take 1 tablet (25 mg total) by mouth 3 (three) times daily as needed for dizziness. 03/02/16   Loleta Rose, MD  meloxicam (MOBIC) 15 MG tablet Take 1 tablet (15 mg total) by mouth daily. 05/20/15   Kem Boroughs B, FNP    Allergies Piroxicam  Family History  Problem Relation Age of Onset  . Breast cancer Maternal Grandmother     Social History Social History   Tobacco Use  . Smoking status: Never Smoker  . Smokeless tobacco: Never Used  Substance Use Topics  . Alcohol use: No  . Drug use: Not on file    Review of Systems  Constitutional: No fever/chills Eyes: No visual changes. ENT: No sore throat. Cardiovascular: Denies chest pain. Respiratory: Denies shortness  of breath. Gastrointestinal: No abdominal pain.  No nausea, no vomiting.  No diarrhea.  No constipation. Genitourinary: Negative for dysuria. Musculoskeletal: Positive for left fourth finger laceration.  Negative for back pain. Skin: Negative for rash. Neurological: Negative for headaches, focal weakness or numbness.   ____________________________________________   PHYSICAL EXAM:  VITAL SIGNS: ED Triage Vitals  Enc Vitals Group     BP 07/13/19 2345 140/70     Pulse Rate 07/13/19 2345 73     Resp 07/13/19 2345 18     Temp 07/13/19 2345 98.4 F (36.9 C)     Temp Source 07/13/19 2345 Oral     SpO2 07/13/19 2345 98 %     Weight 07/13/19 2347 220 lb (99.8 kg)     Height 07/13/19 2347 5\' 7"  (1.702 m)     Head Circumference --      Peak Flow --      Pain Score 07/13/19 2346 0     Pain Loc --      Pain Edu? --      Excl. in [place]? --     Constitutional: Alert and oriented. Well appearing and in no acute distress. Eyes: Conjunctivae are normal. PERRL. EOMI. Head: Atraumatic. Nose: No congestion/rhinnorhea. Mouth/Throat: Mucous membranes are moist.  Oropharynx non-erythematous. Neck: No stridor.   Cardiovascular: Normal rate, regular rhythm. Grossly normal heart sounds.  Good  peripheral circulation. Respiratory: Normal respiratory effort.  No retractions. Lungs CTAB. Gastrointestinal: Soft and nontender. No distention. No abdominal bruits. No CVA tenderness. Musculoskeletal:  Right fourth digit finger pad with small superficial flap-like laceration with minimal bleeding.  No nailbed injury.  Acrylic nails. No lower extremity tenderness nor edema.  No joint effusions. Neurologic:  Normal speech and language. No gross focal neurologic deficits are appreciated. No gait instability. Skin:  Skin is warm, dry and intact. No rash noted. Psychiatric: Mood and affect are normal. Speech and behavior are normal.  ____________________________________________   LABS (all labs ordered are  listed, but only abnormal results are displayed)  Labs Reviewed - No data to display ____________________________________________  EKG  None ____________________________________________  RADIOLOGY  ED MD interpretation: None  Official radiology report(s): No results found.  ____________________________________________   PROCEDURES  Procedure(s) performed (including Critical Care):  Procedures   ____________________________________________   INITIAL IMPRESSION / ASSESSMENT AND PLAN / ED COURSE  As part of my medical decision making, I reviewed the following data within the [place] notes reviewed and incorporated, Old chart reviewed and Notes from prior ED visits     McDuffie was evaluated in Emergency Department on 07/14/2019 for the symptoms described in the history of present illness. She was evaluated in the context of the global COVID-19 pandemic, which necessitated consideration that the patient might be at risk for infection with the SARS-CoV-2 virus that causes COVID-19. Institutional protocols and algorithms that pertain to the evaluation of patients at risk for COVID-19 are in a state of rapid change based on information released by regulatory bodies including the CDC and federal and state organizations. These policies and algorithms were followed during the patient's care in the ED.    29-year female presenting with minor finger laceration not requiring sutures.  Will apply nonadherent dressing.  Patient prefers to have tetanus shot by her PCP; I have advised her to get this within 72 hours.  She also asked me about whether or not she should get the COVID-19 vaccination.  I did recommend that she get this in light of her medical history including lupus and also due to the fact that she will be going to [place] later this summer.  She will discuss with her PCP.  Strict return precautions given.  Patient verbalizes understanding and  agrees with plan of care.  Patient did not like the dressing; she ended up having the area Dermabonded and new dressing placed which was to her satisfaction.     ____________________________________________   FINAL CLINICAL IMPRESSION(S) / ED DIAGNOSES  Final diagnoses:  Laceration of left little finger without foreign body without damage to nail, initial encounter     ED Discharge Orders    None       Note:  This document was prepared using Dragon voice recognition software and may include unintentional dictation errors.   Paulette Blanch, MD 07/14/19 [phone]

## 2020-08-03 IMAGING — US US EXTREM  UP VENOUS*L*
1 series · 13 of 24 positions shown · non-contrast
Comparison: Chest x-ray 02/23/2019.

CLINICAL DATA: Left arm pain.  History of lupus.



[Series 1: us extrem up venous*left* · 13 of 31 slices shown]
[im 1/31]
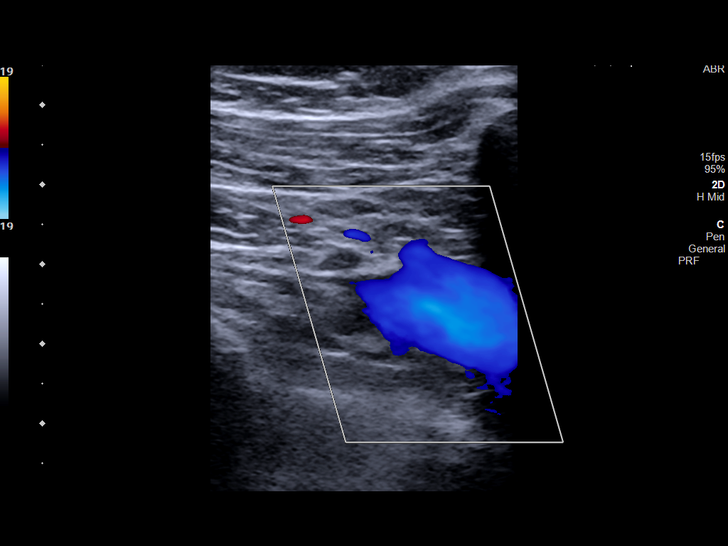
[im 3/31]
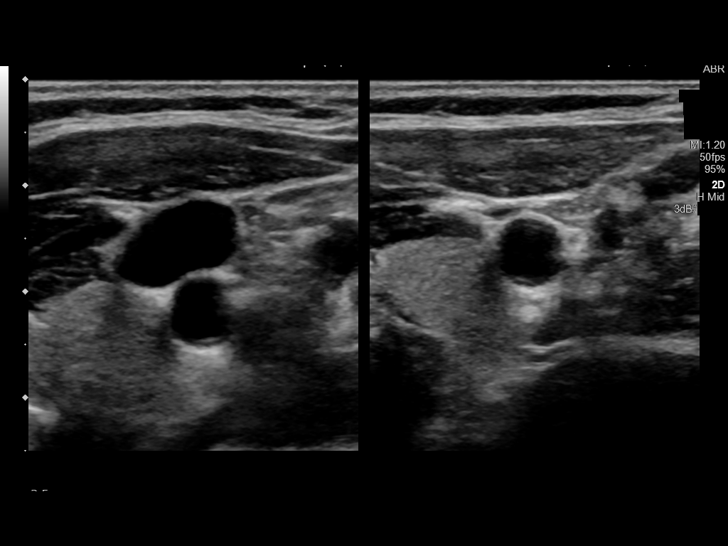
[im 6/31]
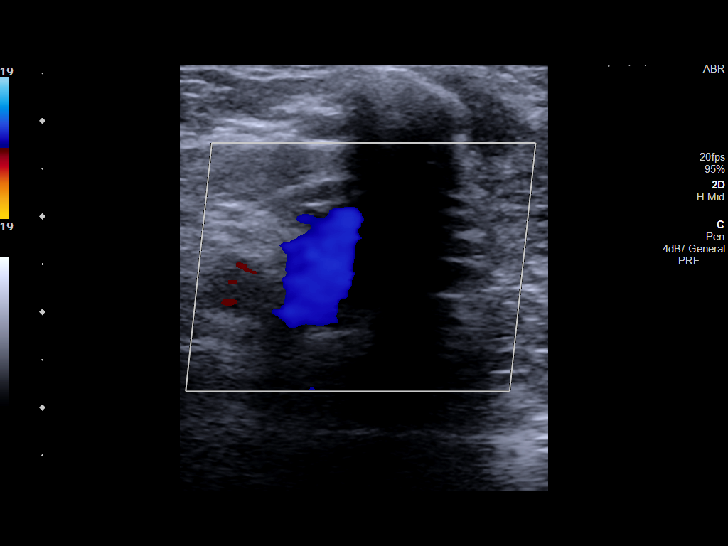
[im 8/31]
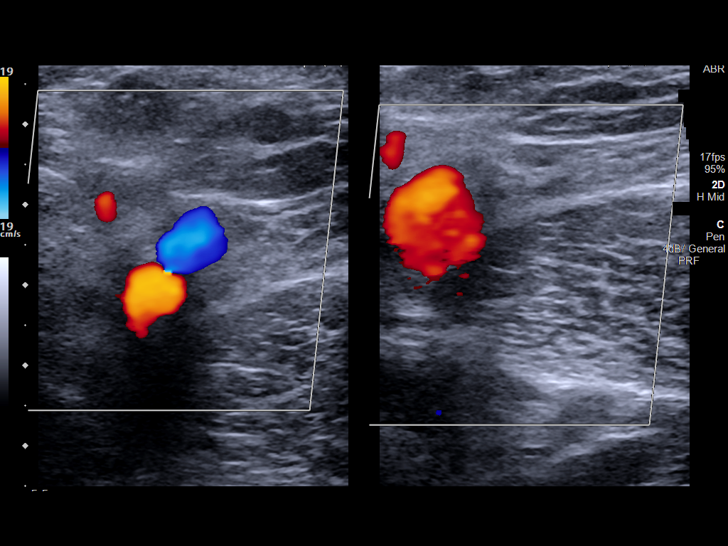
[im 11/31]
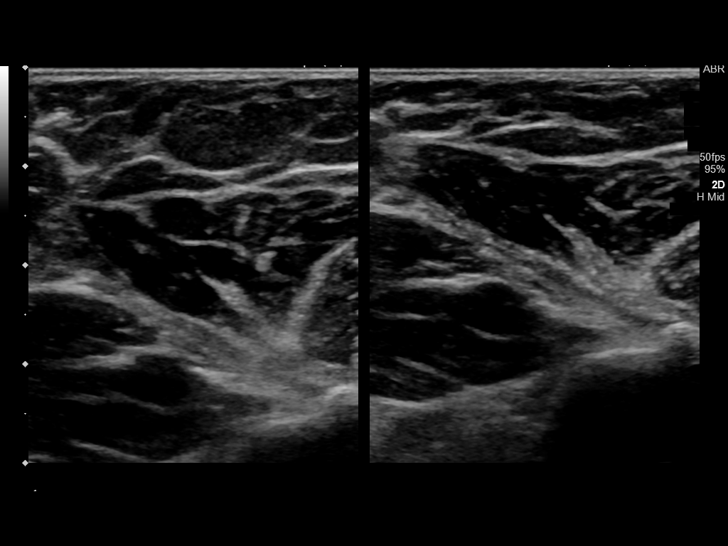
[im 14/31]
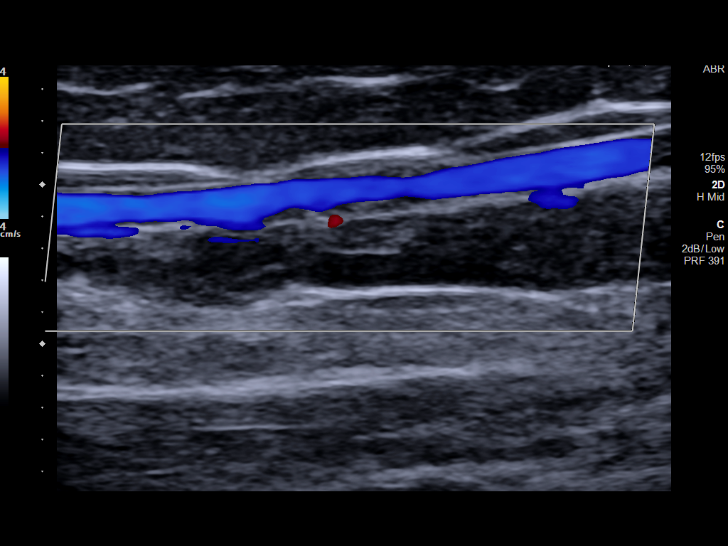
[im 16/31]
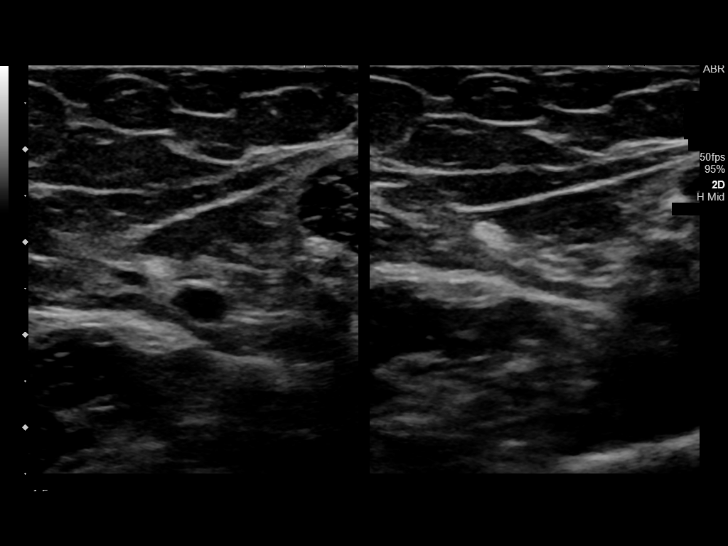
[im 17/31]
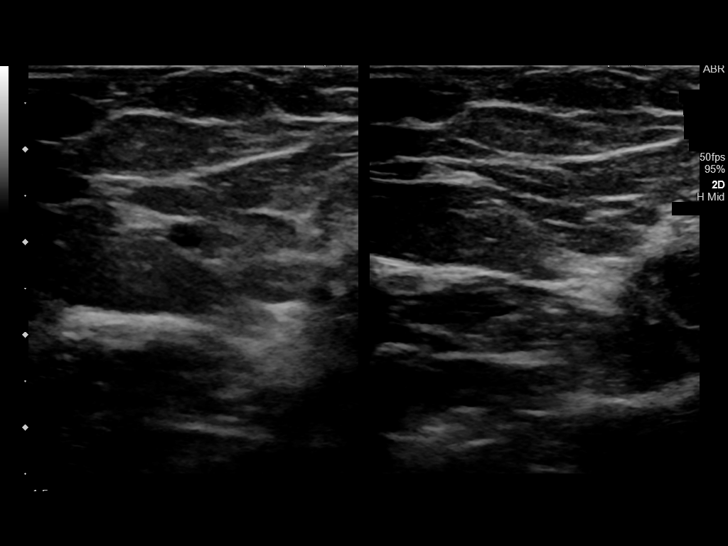
[im 20/31]
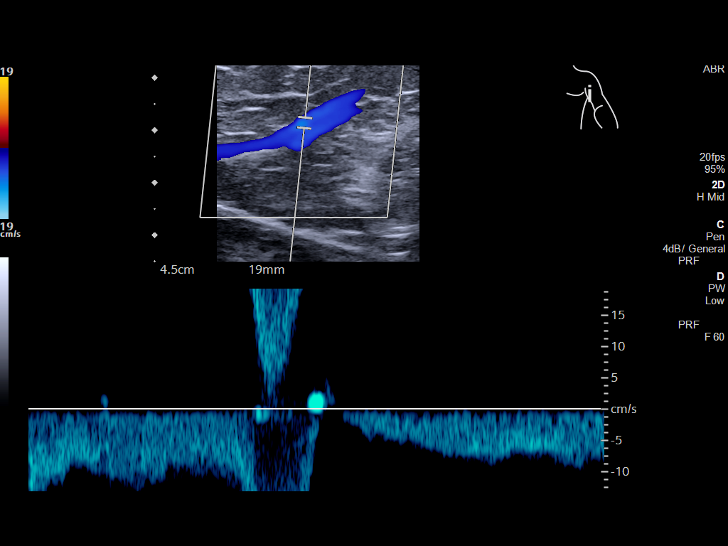
[im 23/31]
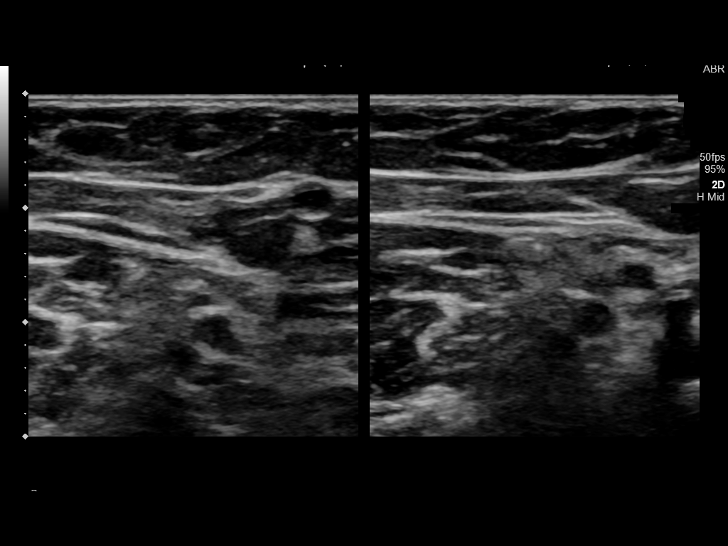
[im 25/31]
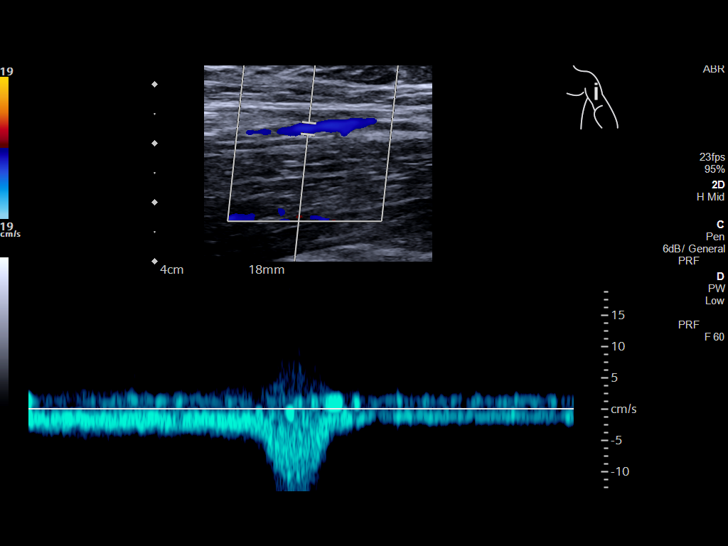
[im 28/31]
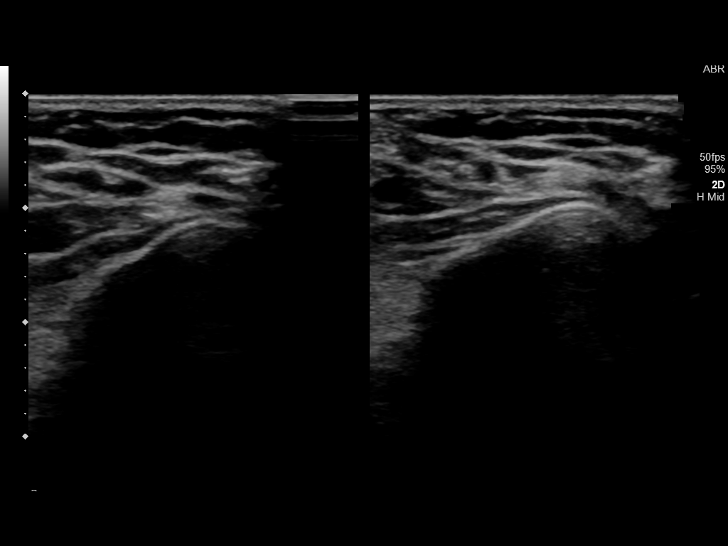
[im 31/31]
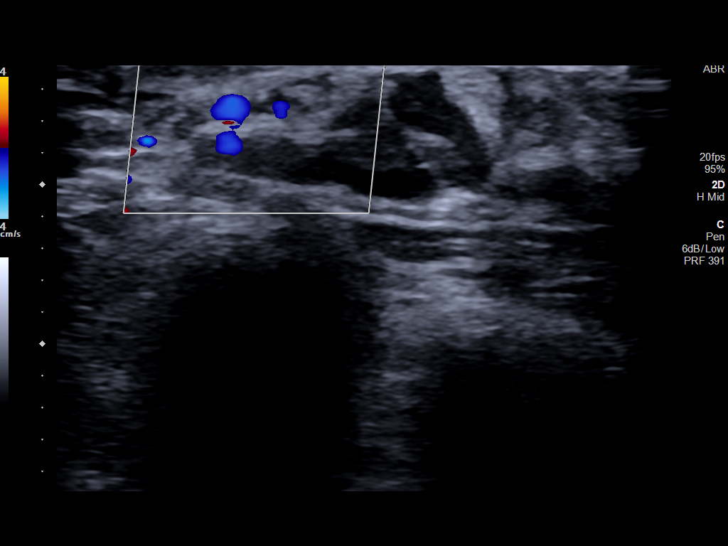

[13 of 24 positions shown; findings below may reference images not displayed]

FINDINGS: Contralateral Subclavian Vein: Respiratory phasicity is normal and
symmetric with the symptomatic side. No evidence of thrombus. Normal
compressibility.

Internal Jugular Vein: No evidence of thrombus. Normal
compressibility, respiratory phasicity and response to augmentation.

Subclavian Vein: No evidence of thrombus. Normal compressibility,
respiratory phasicity and response to augmentation.

Axillary Vein: No evidence of thrombus. Normal compressibility,
respiratory phasicity and response to augmentation.

Cephalic Vein: No evidence of thrombus. Normal compressibility,
respiratory phasicity and response to augmentation.

Basilic Vein: No evidence of thrombus. Normal compressibility,
respiratory phasicity and response to augmentation.

Brachial Veins: No evidence of thrombus. Normal compressibility,
respiratory phasicity and response to augmentation.

Radial Veins: No evidence of thrombus. Normal compressibility,
respiratory phasicity and response to augmentation.

Ulnar Veins: No evidence of thrombus. Normal compressibility,
respiratory phasicity and response to augmentation.

Other Findings:  None visualized.
IMPRESSION: No evidence of DVT within the left upper extremity.

## 2021-01-12 ENCOUNTER — Ambulatory Visit
Admission: EM | Admit: 2021-01-12 | Discharge: 2021-01-12 | Disposition: A | Discharge status: Home / Self Care | Payer: BC Managed Care – PPO | Attending: Emergency Medicine | Admitting: Emergency Medicine

## 2021-01-12 ENCOUNTER — Other Ambulatory Visit: Payer: Self-pay

## 2021-01-12 ENCOUNTER — Ambulatory Visit: Admit: 2021-01-12 | Payer: BC Managed Care – PPO

## 2021-01-12 DIAGNOSIS — Z8673 Personal history of transient ischemic attack (TIA), and cerebral infarction without residual deficits: Secondary | ICD-10-CM | POA: Diagnosis not present

## 2021-01-12 DIAGNOSIS — J069 Acute upper respiratory infection, unspecified: Secondary | ICD-10-CM | POA: Diagnosis present

## 2021-01-12 DIAGNOSIS — Z20822 Contact with and (suspected) exposure to covid-19: Secondary | ICD-10-CM | POA: Insufficient documentation

## 2021-01-12 DIAGNOSIS — M329 Systemic lupus erythematosus, unspecified: Secondary | ICD-10-CM | POA: Diagnosis not present

## 2021-01-12 LAB — RESP PANEL BY RT-PCR (FLU A&B, COVID) ARPGX2
Influenza A by PCR: NEGATIVE
Influenza B by PCR: NEGATIVE
SARS Coronavirus 2 by RT PCR: NEGATIVE

## 2021-01-12 LAB — GROUP A STREP BY PCR: Group A Strep by PCR: NOT DETECTED

## 2021-01-12 MED ORDER — IBUPROFEN 800 MG PO TABS: 800.0000 mg | ORAL_TABLET | Freq: Three times a day (TID) | ORAL | 0 refills | Status: AC

## 2021-01-12 MED ORDER — LIDOCAINE VISCOUS HCL 2 % MT SOLN: 15.0000 mL | OROMUCOSAL | 0 refills | Status: AC | PRN

## 2021-01-12 MED ORDER — DM-GUAIFENESIN ER 30-600 MG PO TB12: 1.0000 | ORAL_TABLET | Freq: Two times a day (BID) | ORAL | 0 refills | Status: AC

## 2021-01-12 NOTE — ED Provider Notes (Signed)
[facility]    CSN: [id number] Arrival date & time: 01/12/21  1437      History   Chief Complaint Chief Complaint  Patient presents with   Nasal Congestion    HPI Lydia Miller is a 30 y.o. female.   Patient presents with chills, fever, nasal congestion, rhinorrhea, bilateral ear pain, right greater than left, sore throat, nonproductive cough, intermittent shortness of breath and intermittent generalized headaches for 5 days. No known sick contacts. Has attempted use of robitussin, not helpful. History of lupus, CVA.  Vaccinated for COVID.  Past Medical History:  Diagnosis Date   Collagen vascular disease (HCC)    lupus   Lupus (HCC)    Stroke (HCC)     There are no problems to display for this patient.   Past Surgical History:  Procedure Laterality Date   HERNIA REPAIR      OB History   No obstetric history on file.      Home Medications    Prior to Admission medications   Medication Sig Start Date End Date Taking? Authorizing Provider  aspirin EC 81 MG tablet Take 81 mg by mouth daily.   Yes [provider]  Belimumab (BENLYSTA) 200 MG/ML SOAJ  02/11/20  Yes [provider]  hydroxychloroquine (PLAQUENIL) 200 MG tablet Take 200 mg by mouth 2 (two) times daily.   Yes [provider]  meclizine (ANTIVERT) 25 MG tablet Take 1 tablet (25 mg total) by mouth 3 (three) times daily as needed for dizziness. 03/02/16   Loleta Rose, MD  meloxicam (MOBIC) 15 MG tablet Take 1 tablet (15 mg total) by mouth daily. 05/20/15   Chinita Pester, FNP    Family History Family History  Problem Relation Age of Onset   Breast cancer Maternal Grandmother     Social History Social History   Tobacco Use   Smoking status: Never   Smokeless tobacco: Never  Substance Use Topics   Alcohol use: No     Allergies   Amoxicillin, Doxycycline, and Piroxicam   Review of Systems Review of Systems  Constitutional:  Positive for chills and  fever. Negative for activity change, appetite change, diaphoresis, fatigue and unexpected weight change.  HENT:  Positive for congestion, ear pain, rhinorrhea, sore throat and voice change. Negative for dental problem, drooling, ear discharge, facial swelling, hearing loss, mouth sores, nosebleeds, postnasal drip, sinus pressure, sinus pain, sneezing, tinnitus and trouble swallowing.   Respiratory:  Positive for cough and shortness of breath. Negative for apnea, choking, chest tightness, wheezing and stridor.   Cardiovascular: Negative.   Gastrointestinal: Negative.   Neurological:  Positive for headaches. Negative for dizziness, tremors, seizures, syncope, facial asymmetry, speech difficulty, weakness, light-headedness and numbness.    Physical Exam Triage Vital Signs ED Triage Vitals  Enc Vitals Group     BP 01/12/21 1455 135/71     Pulse Rate 01/12/21 1455 (!) 102     Resp 01/12/21 1455 20     Temp 01/12/21 1455 99.8 F (37.7 C)     Temp Source 01/12/21 1455 Oral     SpO2 01/12/21 1455 100 %     Weight 01/12/21 1445 222 lb (100.7 kg)     Height 01/12/21 1445 5\' 7"  (1.702 m)     Head Circumference --      Peak Flow --      Pain Score --      Pain Loc --      Pain Edu? --  Excl. in GC? --    No data found.  Updated Vital Signs BP 135/71 (BP Location: Left Arm)    Pulse (!) 102    Temp 99.8 F (37.7 C) (Oral)    Resp 20    Ht 5\' 7"  (1.702 m)    Wt 222 lb (100.7 kg)    LMP 12/29/2020    SpO2 100%    BMI 34.77 kg/m   Visual Acuity Right Eye Distance:   Left Eye Distance:   Bilateral Distance:    Right Eye Near:   Left Eye Near:    Bilateral Near:     Physical Exam Constitutional:      Appearance: Normal appearance.  HENT:     Head: Normocephalic.     Right Ear: Hearing, ear canal and external ear normal. A middle ear effusion is present.     Left Ear: Hearing, ear canal and external ear normal. A middle ear effusion is present.     Nose: Congestion and rhinorrhea  present.     Mouth/Throat:     Mouth: Mucous membranes are moist.     Pharynx: Posterior oropharyngeal erythema present.  Eyes:     Extraocular Movements: Extraocular movements intact.  Cardiovascular:     Rate and Rhythm: Normal rate and regular rhythm.     Pulses: Normal pulses.     Heart sounds: Normal heart sounds.  Pulmonary:     Effort: Pulmonary effort is normal.     Breath sounds: Normal breath sounds.  Musculoskeletal:     Cervical back: Normal range of motion and neck supple.  Skin:    General: Skin is warm and dry.  Neurological:     Mental Status: She is alert and oriented to person, place, and time. Mental status is at baseline.  Psychiatric:        Mood and Affect: Mood normal.        Behavior: Behavior normal.     UC Treatments / Results  Labs (all labs ordered are listed, but only abnormal results are displayed) Labs Reviewed  RESP PANEL BY RT-PCR (FLU A&B, COVID) ARPGX2  GROUP A STREP BY PCR    EKG   Radiology No results found.  Procedures Procedures (including critical care time)  Medications Ordered in UC Medications - No data to display  Initial Impression / Assessment and Plan / UC Course  I have reviewed the triage vital signs and the nursing notes.  Pertinent labs & imaging results that were available during my care of the patient were reviewed by me and considered in my medical decision making (see chart for details).  Viral URI with cough  1.  COVID and flu test negative 2.  Strep negative 3.  Lidocaine viscous 2% 15 mils every 4 hours as needed 4.  Mucinex DM 36-600 mg twice daily 5.  Ibuprofen 800 mg 3 times daily as needed 6.  Over-the-counter medications for remaining symptom management 7.  Urgent care follow-up as needed Final Clinical Impressions(s) / UC Diagnoses   Final diagnoses:  None   Discharge Instructions   None    ED Prescriptions   None    PDMP not reviewed this encounter.   14/12/2020,  NP 01/12/21 [phone]

## 2021-01-12 NOTE — ED Triage Notes (Signed)
Pt here with C/O sore throat, cough, nasal congestion since Wednesday. Is having Shortness of breath.

## 2021-01-12 NOTE — Discharge Instructions (Addendum)
Your testing was negative for COVID, flu and strep  Your symptoms today are most likely being caused by a virus and should steadily improve in time it can take up to 7 to 10 days before you truly start to see a turnaround however things will get better    You can take Tylenol and/or Ibuprofen as needed for fever reduction and pain relief.   For cough: honey 1/2 to 1 teaspoon (you can dilute the honey in water or another fluid).  You can use a humidifier for chest congestion and cough.  If you don't have a humidifier, you can sit in the bathroom with the hot shower running.      For sore throat: try warm salt water gargles, cepacol lozenges, throat spray, warm tea or water with lemon/honey, popsicles or ice, or OTC cold relief medicine for throat discomfort.   For congestion: take a daily anti-histamine like Zyrtec, Claritin, and a oral decongestant, such as pseudoephedrine.  You can also use Flonase 1-2 sprays in each nostril daily.   It is important to stay hydrated: drink plenty of fluids (water, gatorade/powerade/pedialyte, juices, or teas) to keep your throat moisturized and help further relieve irritation/discomfort.
